# Patient Record
Sex: Male | Born: 1955 | Hispanic: Yes | Marital: Married | Smoking: Former smoker
Health system: Southern US, Community
[De-identification: ages and names within clinical notes are randomized; demographics above are authoritative.]

## PROBLEM LIST (undated history)

## (undated) DIAGNOSIS — N390 Urinary tract infection, site not specified: Secondary | ICD-10-CM

## (undated) DIAGNOSIS — N400 Enlarged prostate without lower urinary tract symptoms: Secondary | ICD-10-CM

## (undated) DIAGNOSIS — T4145XA Adverse effect of unspecified anesthetic, initial encounter: Secondary | ICD-10-CM

## (undated) DIAGNOSIS — C189 Malignant neoplasm of colon, unspecified: Secondary | ICD-10-CM

## (undated) DIAGNOSIS — IMO0002 Reserved for concepts with insufficient information to code with codable children: Secondary | ICD-10-CM

## (undated) DIAGNOSIS — C801 Malignant (primary) neoplasm, unspecified: Secondary | ICD-10-CM

## (undated) DIAGNOSIS — I10 Essential (primary) hypertension: Secondary | ICD-10-CM

## (undated) DIAGNOSIS — T8859XA Other complications of anesthesia, initial encounter: Secondary | ICD-10-CM

## (undated) HISTORY — PX: OTHER SURGICAL HISTORY: SHX169

## (undated) HISTORY — PX: COLON RESECTION: SHX5231

## (undated) HISTORY — PX: COLON SURGERY: SHX602

## (undated) HISTORY — DX: Reserved for concepts with insufficient information to code with codable children: IMO0002

---

## 2004-09-13 ENCOUNTER — Ambulatory Visit (HOSPITAL_COMMUNITY): Admission: RE | Admit: 2004-09-13 | Discharge: 2004-09-13 | Payer: Self-pay | Admitting: General Surgery

## 2009-08-21 ENCOUNTER — Encounter: Payer: Self-pay | Admitting: Internal Medicine

## 2009-08-22 ENCOUNTER — Telehealth (INDEPENDENT_AMBULATORY_CARE_PROVIDER_SITE_OTHER): Payer: Self-pay

## 2009-09-07 ENCOUNTER — Ambulatory Visit (HOSPITAL_COMMUNITY): Admission: RE | Admit: 2009-09-07 | Discharge: 2009-09-07 | Payer: Self-pay | Admitting: Internal Medicine

## 2009-09-07 ENCOUNTER — Telehealth (INDEPENDENT_AMBULATORY_CARE_PROVIDER_SITE_OTHER): Payer: Self-pay

## 2009-09-07 ENCOUNTER — Ambulatory Visit: Payer: Self-pay | Admitting: Internal Medicine

## 2009-09-11 ENCOUNTER — Encounter (INDEPENDENT_AMBULATORY_CARE_PROVIDER_SITE_OTHER): Payer: Self-pay | Admitting: *Deleted

## 2009-09-11 ENCOUNTER — Encounter: Payer: Self-pay | Admitting: Internal Medicine

## 2009-09-27 ENCOUNTER — Ambulatory Visit (HOSPITAL_COMMUNITY): Admission: RE | Admit: 2009-09-27 | Discharge: 2009-09-27 | Payer: Self-pay | Admitting: General Surgery

## 2009-10-01 ENCOUNTER — Encounter (INDEPENDENT_AMBULATORY_CARE_PROVIDER_SITE_OTHER): Payer: Self-pay | Admitting: General Surgery

## 2009-10-01 ENCOUNTER — Inpatient Hospital Stay (HOSPITAL_COMMUNITY): Admission: RE | Admit: 2009-10-01 | Discharge: 2009-10-05 | Payer: Self-pay | Admitting: General Surgery

## 2009-10-31 ENCOUNTER — Encounter (HOSPITAL_COMMUNITY): Admission: RE | Admit: 2009-10-31 | Discharge: 2009-11-30 | Payer: Self-pay | Admitting: Oncology

## 2009-10-31 ENCOUNTER — Ambulatory Visit (HOSPITAL_COMMUNITY): Payer: Self-pay | Admitting: Oncology

## 2009-11-01 ENCOUNTER — Ambulatory Visit (HOSPITAL_COMMUNITY): Payer: Self-pay | Admitting: Oncology

## 2009-11-02 ENCOUNTER — Ambulatory Visit (HOSPITAL_COMMUNITY): Admission: RE | Admit: 2009-11-02 | Discharge: 2009-11-02 | Payer: Self-pay | Admitting: General Surgery

## 2009-11-07 ENCOUNTER — Ambulatory Visit (HOSPITAL_COMMUNITY): Admission: RE | Admit: 2009-11-07 | Discharge: 2009-11-07 | Payer: Self-pay | Admitting: Oncology

## 2009-11-14 ENCOUNTER — Encounter: Payer: Self-pay | Admitting: Internal Medicine

## 2009-11-30 ENCOUNTER — Encounter (HOSPITAL_COMMUNITY): Admission: RE | Admit: 2009-11-30 | Discharge: 2009-11-30 | Payer: Self-pay | Admitting: Oncology

## 2009-12-10 ENCOUNTER — Encounter (HOSPITAL_COMMUNITY)
Admission: RE | Admit: 2009-12-10 | Discharge: 2010-01-09 | Payer: Self-pay | Source: Home / Self Care | Admitting: Oncology

## 2009-12-13 ENCOUNTER — Encounter: Payer: Self-pay | Admitting: Internal Medicine

## 2009-12-23 ENCOUNTER — Emergency Department (HOSPITAL_COMMUNITY): Admission: EM | Admit: 2009-12-23 | Discharge: 2009-12-23 | Payer: Self-pay | Admitting: Emergency Medicine

## 2009-12-31 ENCOUNTER — Ambulatory Visit (HOSPITAL_COMMUNITY): Payer: Self-pay | Admitting: Oncology

## 2010-01-09 ENCOUNTER — Encounter (HOSPITAL_COMMUNITY)
Admission: RE | Admit: 2010-01-09 | Discharge: 2010-02-08 | Payer: Self-pay | Source: Home / Self Care | Attending: Oncology | Admitting: Oncology

## 2010-01-16 ENCOUNTER — Inpatient Hospital Stay (HOSPITAL_COMMUNITY): Admission: AD | Admit: 2010-01-16 | Discharge: 2010-01-20 | Payer: Self-pay | Admitting: Psychiatry

## 2010-01-16 ENCOUNTER — Emergency Department (HOSPITAL_COMMUNITY)
Admission: EM | Admit: 2010-01-16 | Discharge: 2010-01-16 | Disposition: A | Discharge status: Other Acute Inpatient Hospital | Payer: Self-pay | Source: Home / Self Care | Admitting: Emergency Medicine

## 2010-01-16 ENCOUNTER — Ambulatory Visit: Payer: Self-pay | Admitting: Psychiatry

## 2010-01-17 ENCOUNTER — Encounter: Payer: Self-pay | Admitting: Internal Medicine

## 2010-01-23 ENCOUNTER — Ambulatory Visit (HOSPITAL_COMMUNITY): Payer: Self-pay | Admitting: Oncology

## 2010-01-28 ENCOUNTER — Ambulatory Visit (HOSPITAL_COMMUNITY): Payer: Self-pay | Admitting: Psychology

## 2010-01-31 ENCOUNTER — Encounter: Payer: Self-pay | Admitting: Gastroenterology

## 2010-02-11 ENCOUNTER — Encounter (HOSPITAL_COMMUNITY)
Admission: RE | Admit: 2010-02-11 | Discharge: 2010-03-13 | Payer: Self-pay | Source: Home / Self Care | Attending: Oncology | Admitting: Oncology

## 2010-02-11 ENCOUNTER — Ambulatory Visit (HOSPITAL_COMMUNITY): Payer: Self-pay | Admitting: Oncology

## 2010-02-14 ENCOUNTER — Ambulatory Visit (HOSPITAL_COMMUNITY): Payer: Self-pay | Admitting: Psychology

## 2010-03-06 LAB — DIFFERENTIAL
Basophils Absolute: 0 10*3/uL (ref 0.0–0.1)
Basophils Relative: 0 % (ref 0–1)
Eosinophils Absolute: 0.1 10*3/uL (ref 0.0–0.7)
Eosinophils Relative: 3 % (ref 0–5)
Lymphocytes Relative: 42 % (ref 12–46)
Lymphs Abs: 1.6 10*3/uL (ref 0.7–4.0)
Monocytes Absolute: 0.4 10*3/uL (ref 0.1–1.0)
Monocytes Relative: 13 % — ABNORMAL HIGH (ref 3–12)
Neutro Abs: 1.5 10*3/uL — ABNORMAL LOW (ref 1.7–7.7)
Neutrophils Relative %: 40 % — ABNORMAL LOW (ref 43–77)

## 2010-03-06 LAB — CBC
HCT: 34.3 % — ABNORMAL LOW (ref 39.0–52.0)
Hemoglobin: 12.2 g/dL — ABNORMAL LOW (ref 13.0–17.0)
MCH: 30.1 pg (ref 26.0–34.0)
MCHC: 35.6 g/dL (ref 30.0–36.0)
MCV: 84.7 fL (ref 78.0–100.0)
Platelets: 115 10*3/uL — ABNORMAL LOW (ref 150–400)
RBC: 4.05 MIL/uL — ABNORMAL LOW (ref 4.22–5.81)
RDW: 21.8 % — ABNORMAL HIGH (ref 11.5–15.5)
WBC: 3.7 10*3/uL — ABNORMAL LOW (ref 4.0–10.5)

## 2010-03-14 ENCOUNTER — Encounter: Payer: Self-pay | Admitting: Internal Medicine

## 2010-03-15 ENCOUNTER — Encounter (HOSPITAL_COMMUNITY)
Admission: RE | Admit: 2010-03-15 | Discharge: 2010-04-02 | Payer: Self-pay | Source: Home / Self Care | Attending: Oncology | Admitting: Oncology

## 2010-03-18 ENCOUNTER — Ambulatory Visit (HOSPITAL_COMMUNITY)
Admission: RE | Admit: 2010-03-18 | Discharge: 2010-03-18 | Payer: Self-pay | Source: Home / Self Care | Attending: Psychology | Admitting: Psychology

## 2010-03-18 ENCOUNTER — Ambulatory Visit (HOSPITAL_COMMUNITY): Admit: 2010-03-18 | Payer: Self-pay | Admitting: Oncology

## 2010-03-21 ENCOUNTER — Ambulatory Visit (HOSPITAL_COMMUNITY): Admit: 2010-03-21 | Payer: Self-pay | Admitting: Psychiatry

## 2010-03-22 ENCOUNTER — Other Ambulatory Visit (HOSPITAL_COMMUNITY): Payer: Self-pay | Admitting: Oncology

## 2010-03-22 DIAGNOSIS — C189 Malignant neoplasm of colon, unspecified: Secondary | ICD-10-CM

## 2010-03-28 ENCOUNTER — Encounter: Payer: Self-pay | Admitting: Internal Medicine

## 2010-04-02 NOTE — Letter (Signed)
Summary: Internal Other Domingo Dimes  Internal Other Domingo Dimes   Imported By: Cloria Spring LPN 04/54/0981 19:14:78  _____________________________________________________________________  External Attachment:    Type:   Image     Comment:   External Document

## 2010-04-02 NOTE — Letter (Signed)
Summary: [facility]  [facility]   Imported By: Rexene Alberts 01/17/2010 08:06:29  _____________________________________________________________________  External Attachment:    Type:   Image     Comment:   External Document

## 2010-04-02 NOTE — Letter (Signed)
Summary: Internal Other  Internal Other   Imported By: Peggyann Shoals 09/11/2009 15:17:03  _____________________________________________________________________  External Attachment:    Type:   Image     Comment:   External Document

## 2010-04-02 NOTE — Letter (Signed)
Summary: Scheduled Appointment  [facility]  [address]   [place], [place] [address]   Phone: [phone]  Fax: [phone]    September 11, 2009   Dear: [name]            DOB: [birth year]    I have been instructed to schedule you an appointment with Dr. Tilford Pillar.  Your appointment is as follows:   Date: __________07/21/11_________   Time: ________2:00pm___________     Please be here 15 minutes early.    Please contact their office @ [phone] if you need to reschedule this appointment for a more convenient time.   Thank you,    [facility] Ph: [phone]   Fax: [phone]

## 2010-04-02 NOTE — Letter (Signed)
Summary: OFFICE PROGRESS NOTE  OFFICE PROGRESS NOTE   Imported By: Rexene Alberts 12/13/2009 11:20:39  _____________________________________________________________________  External Attachment:    Type:   Image     Comment:   External Document

## 2010-04-02 NOTE — Progress Notes (Signed)
Summary: phone call/ slight change in time of procedure  Phone Note Outgoing Call   Call placed to: Patient Summary of Call: I called and left message to call. A 30 min change in appt for procedure. Needs to be at [facility] @ 10:00 AM  instead of 10:30. Procedure scheduled for 11:00 AM in stead of 11:30.  Initial call taken by: Cloria Spring LPN,  August 22, 2009 3:00 PM     Appended Document: phone call/ slight change in time of procedure Pt's wife informed of the change in time.

## 2010-04-02 NOTE — Letter (Signed)
Summary: [facility]  [facility]   Imported By: Rexene Alberts 01/31/2010 09:48:21  _____________________________________________________________________  External Attachment:    Type:   Image     Comment:   External Document

## 2010-04-02 NOTE — Progress Notes (Signed)
Summary: phone note/ surgeon choice  Phone Note Call from Patient   Caller: Patient Summary of Call: Pt's wife called to say Dr. Jena Gauss told them today that pt would need referral to surgeon....see opt note. They are requesting Dr. Leticia Penna. Initial call taken by: Cloria Spring LPN,  September 07, 452 2:35 PM     Appended Document: phone note/ surgeon choice OK; please set up referral  Appended Document: phone note/ surgeon choice Pt's referral set up with Dr Leticia Penna on 07/21@ 2:00pm - Unable to reach pt by phone - letter mailed - cdg

## 2010-04-02 NOTE — Letter (Signed)
Summary: [facility]  [facility]   Imported By: Rexene Alberts 11/14/2009 16:13:23  _____________________________________________________________________  External Attachment:    Type:   Image     Comment:   External Document  Appended Document: [facility] needs tcs in about 11 mos  Appended Document: [facility] reminder in computer

## 2010-04-04 NOTE — Letter (Signed)
Summary: [facility]  [facility]   Imported By: Rexene Alberts 03/14/2010 09:14:18  _____________________________________________________________________  External Attachment:    Type:   Image     Comment:   External Document

## 2010-04-04 NOTE — Letter (Signed)
Summary: [facility]  [facility]   Imported By: Rexene Alberts 03/28/2010 10:15:09  _____________________________________________________________________  External Attachment:    Type:   Image     Comment:   External Document

## 2010-04-17 ENCOUNTER — Encounter (HOSPITAL_COMMUNITY): Payer: BC Managed Care – PPO

## 2010-04-17 DIAGNOSIS — Z452 Encounter for adjustment and management of vascular access device: Secondary | ICD-10-CM

## 2010-04-17 DIAGNOSIS — C189 Malignant neoplasm of colon, unspecified: Secondary | ICD-10-CM

## 2010-05-10 ENCOUNTER — Encounter (HOSPITAL_COMMUNITY): Payer: BC Managed Care – PPO | Attending: Oncology

## 2010-05-10 ENCOUNTER — Other Ambulatory Visit (HOSPITAL_COMMUNITY): Payer: Self-pay

## 2010-05-10 DIAGNOSIS — C189 Malignant neoplasm of colon, unspecified: Secondary | ICD-10-CM

## 2010-05-10 DIAGNOSIS — Z79899 Other long term (current) drug therapy: Secondary | ICD-10-CM | POA: Insufficient documentation

## 2010-05-10 DIAGNOSIS — I10 Essential (primary) hypertension: Secondary | ICD-10-CM | POA: Insufficient documentation

## 2010-05-14 ENCOUNTER — Encounter (HOSPITAL_COMMUNITY): Payer: Self-pay

## 2010-05-14 ENCOUNTER — Ambulatory Visit (HOSPITAL_COMMUNITY)
Admission: RE | Admit: 2010-05-14 | Discharge: 2010-05-14 | Disposition: A | Discharge status: Home / Self Care | Payer: BC Managed Care – PPO | Source: Ambulatory Visit | Attending: Oncology | Admitting: Oncology

## 2010-05-14 DIAGNOSIS — C189 Malignant neoplasm of colon, unspecified: Secondary | ICD-10-CM

## 2010-05-14 DIAGNOSIS — Z85038 Personal history of other malignant neoplasm of large intestine: Secondary | ICD-10-CM | POA: Insufficient documentation

## 2010-05-14 DIAGNOSIS — K7689 Other specified diseases of liver: Secondary | ICD-10-CM | POA: Insufficient documentation

## 2010-05-14 DIAGNOSIS — Z9049 Acquired absence of other specified parts of digestive tract: Secondary | ICD-10-CM | POA: Insufficient documentation

## 2010-05-14 DIAGNOSIS — Z09 Encounter for follow-up examination after completed treatment for conditions other than malignant neoplasm: Secondary | ICD-10-CM | POA: Insufficient documentation

## 2010-05-14 HISTORY — DX: Malignant (primary) neoplasm, unspecified: C80.1

## 2010-05-14 HISTORY — DX: Essential (primary) hypertension: I10

## 2010-05-14 LAB — COMPREHENSIVE METABOLIC PANEL
ALT: 23 U/L (ref 0–53)
ALT: 33 U/L (ref 0–53)
AST: 33 U/L (ref 0–37)
AST: 42 U/L — ABNORMAL HIGH (ref 0–37)
Albumin: 3.9 g/dL (ref 3.5–5.2)
Albumin: 4.3 g/dL (ref 3.5–5.2)
Alkaline Phosphatase: 99 U/L (ref 39–117)
Alkaline Phosphatase: 75 U/L (ref 39–117)
BUN: 14 mg/dL (ref 6–23)
BUN: 15 mg/dL (ref 6–23)
CO2: 25 mEq/L (ref 19–32)
CO2: 28 mEq/L (ref 19–32)
Calcium: 9.3 mg/dL (ref 8.4–10.5)
Calcium: 9.7 mg/dL (ref 8.4–10.5)
Chloride: 104 mEq/L (ref 96–112)
Chloride: 103 mEq/L (ref 96–112)
Creatinine, Ser: 0.89 mg/dL (ref 0.4–1.5)
Creatinine, Ser: 1.09 mg/dL (ref 0.4–1.5)
GFR calc Af Amer: >60 mL/min (ref >60)
GFR calc Af Amer: >60 mL/min (ref >60)
GFR calc non Af Amer: >60 mL/min (ref >60)
GFR calc non Af Amer: >60 mL/min (ref >60)
Glucose, Bld: 194 mg/dL — ABNORMAL HIGH (ref 70–99)
Glucose, Bld: 112 mg/dL — ABNORMAL HIGH (ref 70–99)
Potassium: 3.7 mEq/L (ref 3.5–5.1)
Potassium: 3.8 mEq/L (ref 3.5–5.1)
Sodium: 138 mEq/L (ref 135–145)
Sodium: 140 mEq/L (ref 135–145)
Total Bilirubin: 0.8 mg/dL (ref 0.3–1.2)
Total Bilirubin: 1.2 mg/dL (ref 0.3–1.2)
Total Protein: 6.5 g/dL (ref 6.0–8.3)
Total Protein: 7.3 g/dL (ref 6.0–8.3)

## 2010-05-14 LAB — CBC
HCT: 35 % — ABNORMAL LOW (ref 39.0–52.0)
HCT: 37 % — ABNORMAL LOW (ref 39.0–52.0)
HCT: 38.4 % — ABNORMAL LOW (ref 39.0–52.0)
Hemoglobin: 12.2 g/dL — ABNORMAL LOW (ref 13.0–17.0)
Hemoglobin: 12.4 g/dL — ABNORMAL LOW (ref 13.0–17.0)
Hemoglobin: 13.1 g/dL (ref 13.0–17.0)
MCH: 28.3 pg (ref 26.0–34.0)
MCH: 26.5 pg (ref 26.0–34.0)
MCH: 27.5 pg (ref 26.0–34.0)
MCHC: 34.9 g/dL (ref 30.0–36.0)
MCHC: 33.6 g/dL (ref 30.0–36.0)
MCHC: 34 g/dL (ref 30.0–36.0)
MCV: 81.2 fL (ref 78.0–100.0)
MCV: 79 fL (ref 78.0–100.0)
MCV: 80.9 fL (ref 78.0–100.0)
Platelets: 110 10*3/uL — ABNORMAL LOW (ref 150–400)
Platelets: 126 10*3/uL — ABNORMAL LOW (ref 150–400)
Platelets: 159 10*3/uL (ref 150–400)
RBC: 4.31 MIL/uL (ref 4.22–5.81)
RBC: 4.69 MIL/uL (ref 4.22–5.81)
RBC: 4.75 MIL/uL (ref 4.22–5.81)
RDW: 26.5 % — ABNORMAL HIGH (ref 11.5–15.5)
RDW: 27.6 % — ABNORMAL HIGH (ref 11.5–15.5)
RDW: 32.2 % — ABNORMAL HIGH (ref 11.5–15.5)
WBC: 3.6 10*3/uL — ABNORMAL LOW (ref 4.0–10.5)
WBC: 4.3 10*3/uL (ref 4.0–10.5)
WBC: 5.3 10*3/uL (ref 4.0–10.5)

## 2010-05-14 LAB — RAPID URINE DRUG SCREEN, HOSP PERFORMED
Amphetamines: NOT DETECTED
Barbiturates: NOT DETECTED
Benzodiazepines: POSITIVE — AB
Cocaine: NOT DETECTED
Opiates: NOT DETECTED
Tetrahydrocannabinol: NOT DETECTED

## 2010-05-14 LAB — DIFFERENTIAL
Basophils Absolute: 0 10*3/uL (ref 0.0–0.1)
Basophils Absolute: 0 10*3/uL (ref 0.0–0.1)
Basophils Relative: 0 % (ref 0–1)
Basophils Relative: 0 % (ref 0–1)
Eosinophils Absolute: 0.2 10*3/uL (ref 0.0–0.7)
Eosinophils Absolute: 0.1 10*3/uL (ref 0.0–0.7)
Eosinophils Relative: 4 % (ref 0–5)
Eosinophils Relative: 2 % (ref 0–5)
Lymphocytes Relative: 42 % (ref 12–46)
Lymphocytes Relative: 32 % (ref 12–46)
Lymphs Abs: 1.5 10*3/uL (ref 0.7–4.0)
Lymphs Abs: 1.4 10*3/uL (ref 0.7–4.0)
Monocytes Absolute: 0.4 10*3/uL (ref 0.1–1.0)
Monocytes Absolute: 0.7 10*3/uL (ref 0.1–1.0)
Monocytes Relative: 11 % (ref 3–12)
Monocytes Relative: 16 % — ABNORMAL HIGH (ref 3–12)
Neutro Abs: 1.6 10*3/uL — ABNORMAL LOW (ref 1.7–7.7)
Neutro Abs: 2.1 10*3/uL (ref 1.7–7.7)
Neutrophils Relative %: 43 % (ref 43–77)
Neutrophils Relative %: 50 % (ref 43–77)

## 2010-05-14 LAB — BASIC METABOLIC PANEL
BUN: 7 mg/dL (ref 6–23)
CO2: 23 mEq/L (ref 19–32)
Calcium: 9.5 mg/dL (ref 8.4–10.5)
Chloride: 104 mEq/L (ref 96–112)
Creatinine, Ser: 0.95 mg/dL (ref 0.4–1.5)
GFR calc Af Amer: >60 mL/min (ref >60)
GFR calc non Af Amer: >60 mL/min (ref >60)
Glucose, Bld: 125 mg/dL — ABNORMAL HIGH (ref 70–99)
Potassium: 3.7 mEq/L (ref 3.5–5.1)
Sodium: 139 mEq/L (ref 135–145)

## 2010-05-14 LAB — ETHANOL: Alcohol, Ethyl (B): <5 mg/dL (ref 0–10)

## 2010-05-14 LAB — CEA: CEA: 1.8 ng/mL (ref 0.0–5.0)

## 2010-05-14 MED ORDER — IOHEXOL 300 MG/ML  SOLN
100.0000 mL | Freq: Once | INTRAMUSCULAR | Status: AC | PRN
  Administered 2010-05-14: 100 mL via INTRAVENOUS

## 2010-05-15 ENCOUNTER — Ambulatory Visit (HOSPITAL_COMMUNITY): Payer: Self-pay | Admitting: Oncology

## 2010-05-15 ENCOUNTER — Other Ambulatory Visit (HOSPITAL_COMMUNITY): Payer: Self-pay | Admitting: Oncology

## 2010-05-15 DIAGNOSIS — C189 Malignant neoplasm of colon, unspecified: Secondary | ICD-10-CM

## 2010-05-15 LAB — COMPREHENSIVE METABOLIC PANEL
ALT: 25 U/L (ref 0–53)
AST: 23 U/L (ref 0–37)
AST: 24 U/L (ref 0–37)
Albumin: 3.9 g/dL (ref 3.5–5.2)
CO2: 25 mEq/L (ref 19–32)
CO2: 25 mEq/L (ref 19–32)
Calcium: 9.2 mg/dL (ref 8.4–10.5)
Chloride: 102 mEq/L (ref 96–112)
Creatinine, Ser: 0.77 mg/dL (ref 0.4–1.5)
Creatinine, Ser: 0.84 mg/dL (ref 0.4–1.5)
GFR calc Af Amer: >60 mL/min (ref >60)
GFR calc Af Amer: >60 mL/min (ref >60)
GFR calc non Af Amer: >60 mL/min (ref >60)
Glucose, Bld: 136 mg/dL — ABNORMAL HIGH (ref 70–99)
Sodium: 138 mEq/L (ref 135–145)
Total Bilirubin: 1 mg/dL (ref 0.3–1.2)

## 2010-05-15 LAB — DIFFERENTIAL
Basophils Absolute: 0 10*3/uL (ref 0.0–0.1)
Eosinophils Absolute: 0 10*3/uL (ref 0.0–0.7)
Eosinophils Absolute: 0.1 10*3/uL (ref 0.0–0.7)
Eosinophils Relative: 1 % (ref 0–5)
Eosinophils Relative: 3 % (ref 0–5)
Lymphocytes Relative: 28 % (ref 12–46)
Lymphocytes Relative: 33 % (ref 12–46)
Lymphs Abs: 1.7 10*3/uL (ref 0.7–4.0)
Monocytes Absolute: 0.5 10*3/uL (ref 0.1–1.0)
Monocytes Relative: 9 % (ref 3–12)
Neutrophils Relative %: 53 % (ref 43–77)

## 2010-05-15 LAB — URINALYSIS, ROUTINE W REFLEX MICROSCOPIC
Bilirubin Urine: NEGATIVE
Glucose, UA: NEGATIVE mg/dL
Hgb urine dipstick: NEGATIVE
Ketones, ur: NEGATIVE mg/dL
Protein, ur: NEGATIVE mg/dL
pH: 6 (ref 5.0–8.0)

## 2010-05-15 LAB — CBC
HCT: 33.7 % — ABNORMAL LOW (ref 39.0–52.0)
Hemoglobin: 11.1 g/dL — ABNORMAL LOW (ref 13.0–17.0)
Hemoglobin: 11.2 g/dL — ABNORMAL LOW (ref 13.0–17.0)
MCH: 25 pg — ABNORMAL LOW (ref 26.0–34.0)
MCH: 25.4 pg — ABNORMAL LOW (ref 26.0–34.0)
MCHC: 33 g/dL (ref 30.0–36.0)
MCHC: 33.7 g/dL (ref 30.0–36.0)
MCV: 76.9 fL — ABNORMAL LOW (ref 78.0–100.0)
Platelets: 138 10*3/uL — ABNORMAL LOW (ref 150–400)
RBC: 4.49 MIL/uL (ref 4.22–5.81)

## 2010-05-16 LAB — COMPREHENSIVE METABOLIC PANEL
ALT: 29 U/L (ref 0–53)
AST: 24 U/L (ref 0–37)
AST: 18 U/L (ref 0–37)
Albumin: 4.1 g/dL (ref 3.5–5.2)
Albumin: 4.3 g/dL (ref 3.5–5.2)
Alkaline Phosphatase: 84 U/L (ref 39–117)
Calcium: 9.3 mg/dL (ref 8.4–10.5)
Chloride: 105 mEq/L (ref 96–112)
Creatinine, Ser: 0.65 mg/dL (ref 0.4–1.5)
GFR calc Af Amer: >60 mL/min (ref >60)
Potassium: 3.8 mEq/L (ref 3.5–5.1)
Sodium: 132 mEq/L — ABNORMAL LOW (ref 135–145)
Total Protein: 6.8 g/dL (ref 6.0–8.3)
Total Protein: 7.1 g/dL (ref 6.0–8.3)

## 2010-05-16 LAB — BASIC METABOLIC PANEL
BUN: 9 mg/dL (ref 6–23)
Calcium: 9 mg/dL (ref 8.4–10.5)
GFR calc non Af Amer: >60 mL/min (ref >60)
Glucose, Bld: 146 mg/dL — ABNORMAL HIGH (ref 70–99)
Potassium: 3.5 mEq/L (ref 3.5–5.1)

## 2010-05-16 LAB — CBC
HCT: 33.3 % — ABNORMAL LOW (ref 39.0–52.0)
HCT: 33.5 % — ABNORMAL LOW (ref 39.0–52.0)
Hemoglobin: 10.7 g/dL — ABNORMAL LOW (ref 13.0–17.0)
MCHC: 33.7 g/dL (ref 30.0–36.0)
MCHC: 32.3 g/dL (ref 30.0–36.0)
Platelets: 186 10*3/uL (ref 150–400)
Platelets: 222 10*3/uL (ref 150–400)
RDW: 16.5 % — ABNORMAL HIGH (ref 11.5–15.5)
RDW: 16.7 % — ABNORMAL HIGH (ref 11.5–15.5)
RDW: 16.2 % — ABNORMAL HIGH (ref 11.5–15.5)
WBC: 4.9 10*3/uL (ref 4.0–10.5)

## 2010-05-16 LAB — DIFFERENTIAL
Basophils Absolute: 0 10*3/uL (ref 0.0–0.1)
Basophils Relative: 0 % (ref 0–1)
Basophils Relative: 1 % (ref 0–1)
Eosinophils Absolute: 0.1 10*3/uL (ref 0.0–0.7)
Eosinophils Absolute: 0.1 10*3/uL (ref 0.0–0.7)
Eosinophils Relative: 1 % (ref 0–5)
Lymphocytes Relative: 40 % (ref 12–46)
Lymphs Abs: 1.8 10*3/uL (ref 0.7–4.0)
Monocytes Absolute: 0.5 10*3/uL (ref 0.1–1.0)
Monocytes Absolute: 0.5 10*3/uL (ref 0.1–1.0)
Monocytes Absolute: 0.4 10*3/uL (ref 0.1–1.0)
Monocytes Relative: 11 % (ref 3–12)
Monocytes Relative: 9 % (ref 3–12)
Neutro Abs: 1.4 10*3/uL — ABNORMAL LOW (ref 1.7–7.7)
Neutro Abs: 2.1 10*3/uL (ref 1.7–7.7)

## 2010-05-16 LAB — CEA: CEA: 1.3 ng/mL (ref 0.0–5.0)

## 2010-05-17 ENCOUNTER — Encounter (HOSPITAL_COMMUNITY): Payer: Self-pay | Admitting: Psychology

## 2010-05-17 LAB — DIFFERENTIAL
Basophils Absolute: 0 10*3/uL (ref 0.0–0.1)
Basophils Absolute: 0 10*3/uL (ref 0.0–0.1)
Basophils Absolute: 0 K/uL (ref 0.0–0.1)
Basophils Relative: 0 % (ref 0–1)
Basophils Relative: 0 % (ref 0–1)
Basophils Relative: 0 % (ref 0–1)
Eosinophils Absolute: 0 10*3/uL (ref 0.0–0.7)
Eosinophils Absolute: 0.1 K/uL (ref 0.0–0.7)
Eosinophils Absolute: 0.2 10*3/uL (ref 0.0–0.7)
Eosinophils Relative: 0 % (ref 0–5)
Eosinophils Relative: 1 % (ref 0–5)
Eosinophils Relative: 3 % (ref 0–5)
Lymphocytes Relative: 20 % (ref 12–46)
Lymphs Abs: 1 10*3/uL (ref 0.7–4.0)
Lymphs Abs: 1.1 10*3/uL (ref 0.7–4.0)
Lymphs Abs: 1.4 K/uL (ref 0.7–4.0)
Monocytes Absolute: 0.4 10*3/uL (ref 0.1–1.0)
Monocytes Absolute: 0.6 K/uL (ref 0.1–1.0)
Monocytes Relative: 9 % (ref 3–12)
Neutro Abs: 3.8 10*3/uL (ref 1.7–7.7)
Neutro Abs: 4.9 K/uL (ref 1.7–7.7)
Neutrophils Relative %: 70 % (ref 43–77)
Neutrophils Relative %: 71 % (ref 43–77)
Neutrophils Relative %: 77 % (ref 43–77)

## 2010-05-17 LAB — BASIC METABOLIC PANEL
BUN: 12 mg/dL (ref 6–23)
CO2: 28 mEq/L (ref 19–32)
Calcium: 8.5 mg/dL (ref 8.4–10.5)
Chloride: 102 mEq/L (ref 96–112)
Creatinine, Ser: 0.86 mg/dL (ref 0.4–1.5)
GFR calc Af Amer: >60 mL/min (ref >60)

## 2010-05-17 LAB — COMPREHENSIVE METABOLIC PANEL
ALT: 22 U/L (ref 0–53)
AST: 21 U/L (ref 0–37)
AST: 19 U/L (ref 0–37)
Alkaline Phosphatase: 88 U/L (ref 39–117)
Alkaline Phosphatase: 52 U/L (ref 39–117)
BUN: 16 mg/dL (ref 6–23)
BUN: 8 mg/dL (ref 6–23)
CO2: 24 mEq/L (ref 19–32)
CO2: 30 mEq/L (ref 19–32)
Calcium: 9.2 mg/dL (ref 8.4–10.5)
Chloride: 107 mEq/L (ref 96–112)
Chloride: 103 mEq/L (ref 96–112)
Chloride: 104 mEq/L (ref 96–112)
Creatinine, Ser: 0.65 mg/dL (ref 0.4–1.5)
GFR calc Af Amer: >60 mL/min (ref >60)
GFR calc Af Amer: >60 mL/min (ref >60)
GFR calc non Af Amer: >60 mL/min (ref >60)
GFR calc non Af Amer: >60 mL/min (ref >60)
Glucose, Bld: 98 mg/dL (ref 70–99)
Potassium: 4.4 mEq/L (ref 3.5–5.1)
Potassium: 3.3 mEq/L — ABNORMAL LOW (ref 3.5–5.1)
Sodium: 139 mEq/L (ref 135–145)
Total Bilirubin: 0.3 mg/dL (ref 0.3–1.2)
Total Bilirubin: 0.5 mg/dL (ref 0.3–1.2)

## 2010-05-17 LAB — COMPREHENSIVE METABOLIC PANEL WITH GFR
ALT: 24 U/L (ref 0–53)
AST: 21 U/L (ref 0–37)
Albumin: 3.4 g/dL — ABNORMAL LOW (ref 3.5–5.2)
Alkaline Phosphatase: 53 U/L (ref 39–117)
BUN: 9 mg/dL (ref 6–23)
CO2: 29 meq/L (ref 19–32)
Calcium: 8.8 mg/dL (ref 8.4–10.5)
Chloride: 102 meq/L (ref 96–112)
Creatinine, Ser: 0.79 mg/dL (ref 0.4–1.5)
GFR calc Af Amer: >60 mL/min (ref >60)
GFR calc non Af Amer: >60 mL/min (ref >60)
Glucose, Bld: 105 mg/dL — ABNORMAL HIGH (ref 70–99)
Potassium: 3.5 meq/L (ref 3.5–5.1)
Sodium: 135 meq/L (ref 135–145)
Total Bilirubin: 0.6 mg/dL (ref 0.3–1.2)
Total Protein: 6.3 g/dL (ref 6.0–8.3)

## 2010-05-17 LAB — CBC
HCT: 33.7 % — ABNORMAL LOW (ref 39.0–52.0)
HCT: 25.9 % — ABNORMAL LOW (ref 39.0–52.0)
HCT: 27.7 % — ABNORMAL LOW (ref 39.0–52.0)
HCT: 29.6 % — ABNORMAL LOW (ref 39.0–52.0)
Hemoglobin: 10.8 g/dL — ABNORMAL LOW (ref 13.0–17.0)
Hemoglobin: 8.5 g/dL — ABNORMAL LOW (ref 13.0–17.0)
Hemoglobin: 9.2 g/dL — ABNORMAL LOW (ref 13.0–17.0)
Hemoglobin: 9.8 g/dL — ABNORMAL LOW (ref 13.0–17.0)
MCH: 23.7 pg — ABNORMAL LOW (ref 26.0–34.0)
MCH: 25.2 pg — ABNORMAL LOW (ref 26.0–34.0)
MCH: 25.3 pg — ABNORMAL LOW (ref 26.0–34.0)
MCH: 25.3 pg — ABNORMAL LOW (ref 26.0–34.0)
MCH: 25.5 pg — ABNORMAL LOW (ref 26.0–34.0)
MCHC: 32.1 g/dL (ref 30.0–36.0)
MCHC: 32.8 g/dL (ref 30.0–36.0)
MCHC: 32.8 g/dL (ref 30.0–36.0)
MCHC: 33 g/dL (ref 30.0–36.0)
MCHC: 33.1 g/dL (ref 30.0–36.0)
MCV: 76.4 fL — ABNORMAL LOW (ref 78.0–100.0)
MCV: 76.7 fL — ABNORMAL LOW (ref 78.0–100.0)
MCV: 76.8 fL — ABNORMAL LOW (ref 78.0–100.0)
MCV: 77 fL — ABNORMAL LOW (ref 78.0–100.0)
Platelets: 186 K/uL (ref 150–400)
Platelets: 187 K/uL (ref 150–400)
Platelets: 215 10*3/uL (ref 150–400)
Platelets: 227 K/uL (ref 150–400)
RBC: 4.57 MIL/uL (ref 4.22–5.81)
RBC: 3.38 MIL/uL — ABNORMAL LOW (ref 4.22–5.81)
RBC: 3.61 MIL/uL — ABNORMAL LOW (ref 4.22–5.81)
RBC: 3.83 MIL/uL — ABNORMAL LOW (ref 4.22–5.81)
RBC: 3.87 MIL/uL — ABNORMAL LOW (ref 4.22–5.81)
RDW: 14.1 % (ref 11.5–15.5)
RDW: 14.2 % (ref 11.5–15.5)
RDW: 14.2 % (ref 11.5–15.5)
RDW: 14.6 % (ref 11.5–15.5)
WBC: 5.4 K/uL (ref 4.0–10.5)
WBC: 5.4 K/uL (ref 4.0–10.5)
WBC: 7 K/uL (ref 4.0–10.5)

## 2010-05-18 LAB — CBC
MCH: 25.4 pg — ABNORMAL LOW (ref 26.0–34.0)
MCHC: 33 g/dL (ref 30.0–36.0)
Platelets: 216 10*3/uL (ref 150–400)
RBC: 3.96 MIL/uL — ABNORMAL LOW (ref 4.22–5.81)
RDW: 13.9 % (ref 11.5–15.5)

## 2010-05-18 LAB — CROSSMATCH

## 2010-05-18 LAB — BASIC METABOLIC PANEL
BUN: 17 mg/dL (ref 6–23)
CO2: 27 mEq/L (ref 19–32)
Calcium: 9.2 mg/dL (ref 8.4–10.5)
Creatinine, Ser: 0.69 mg/dL (ref 0.4–1.5)
GFR calc Af Amer: >60 mL/min (ref >60)
Glucose, Bld: 90 mg/dL (ref 70–99)

## 2010-05-18 LAB — SURGICAL PCR SCREEN: Staphylococcus aureus: NEGATIVE

## 2010-05-19 LAB — HEPATIC FUNCTION PANEL
ALT: 28 U/L (ref 0–53)
Albumin: 3.9 g/dL (ref 3.5–5.2)
Alkaline Phosphatase: 61 U/L (ref 39–117)
Total Bilirubin: 0.6 mg/dL (ref 0.3–1.2)

## 2010-05-19 LAB — BASIC METABOLIC PANEL
BUN: 13 mg/dL (ref 6–23)
Chloride: 106 mEq/L (ref 96–112)
Glucose, Bld: 139 mg/dL — ABNORMAL HIGH (ref 70–99)
Potassium: 3.5 mEq/L (ref 3.5–5.1)
Sodium: 134 mEq/L — ABNORMAL LOW (ref 135–145)

## 2010-05-19 LAB — CBC
HCT: 31.6 % — ABNORMAL LOW (ref 39.0–52.0)
Hemoglobin: 10.5 g/dL — ABNORMAL LOW (ref 13.0–17.0)
MCHC: 33.3 g/dL (ref 30.0–36.0)
MCV: 79.4 fL (ref 78.0–100.0)
RDW: 12.8 % (ref 11.5–15.5)

## 2010-05-20 ENCOUNTER — Encounter (HOSPITAL_COMMUNITY): Payer: Self-pay | Admitting: Psychology

## 2010-05-22 ENCOUNTER — Other Ambulatory Visit (HOSPITAL_COMMUNITY): Payer: Self-pay | Admitting: Oncology

## 2010-05-22 DIAGNOSIS — R918 Other nonspecific abnormal finding of lung field: Secondary | ICD-10-CM

## 2010-05-29 ENCOUNTER — Encounter (HOSPITAL_COMMUNITY): Payer: Self-pay

## 2010-05-29 DIAGNOSIS — C189 Malignant neoplasm of colon, unspecified: Secondary | ICD-10-CM

## 2010-05-29 DIAGNOSIS — Z452 Encounter for adjustment and management of vascular access device: Secondary | ICD-10-CM

## 2010-07-09 ENCOUNTER — Other Ambulatory Visit (HOSPITAL_COMMUNITY): Payer: Self-pay

## 2010-07-12 ENCOUNTER — Encounter (HOSPITAL_COMMUNITY): Payer: Self-pay | Attending: Oncology

## 2010-07-12 DIAGNOSIS — Z452 Encounter for adjustment and management of vascular access device: Secondary | ICD-10-CM

## 2010-07-12 DIAGNOSIS — C189 Malignant neoplasm of colon, unspecified: Secondary | ICD-10-CM

## 2010-08-16 ENCOUNTER — Other Ambulatory Visit (HOSPITAL_COMMUNITY): Payer: Self-pay | Admitting: Oncology

## 2010-08-16 ENCOUNTER — Encounter (HOSPITAL_COMMUNITY): Payer: BC Managed Care – PPO | Attending: Oncology | Admitting: Oncology

## 2010-08-16 DIAGNOSIS — Z85038 Personal history of other malignant neoplasm of large intestine: Secondary | ICD-10-CM | POA: Insufficient documentation

## 2010-08-16 DIAGNOSIS — Z09 Encounter for follow-up examination after completed treatment for conditions other than malignant neoplasm: Secondary | ICD-10-CM | POA: Insufficient documentation

## 2010-08-16 DIAGNOSIS — C189 Malignant neoplasm of colon, unspecified: Secondary | ICD-10-CM

## 2010-08-16 LAB — CBC
HCT: 40.7 % (ref 39.0–52.0)
MCHC: 33.4 g/dL (ref 30.0–36.0)
Platelets: 146 10*3/uL — ABNORMAL LOW (ref 150–400)
RDW: 15.8 % — ABNORMAL HIGH (ref 11.5–15.5)
WBC: 3.8 10*3/uL — ABNORMAL LOW (ref 4.0–10.5)

## 2010-08-16 LAB — DIFFERENTIAL
Basophils Absolute: 0 10*3/uL (ref 0.0–0.1)
Basophils Relative: 0 % (ref 0–1)
Lymphocytes Relative: 37 % (ref 12–46)
Monocytes Absolute: 0.3 10*3/uL (ref 0.1–1.0)
Neutro Abs: 2 10*3/uL (ref 1.7–7.7)
Neutrophils Relative %: 53 % (ref 43–77)

## 2010-08-16 LAB — COMPREHENSIVE METABOLIC PANEL
ALT: 44 U/L (ref 0–53)
AST: 31 U/L (ref 0–37)
Albumin: 4.6 g/dL (ref 3.5–5.2)
CO2: 26 mEq/L (ref 19–32)
Chloride: 102 mEq/L (ref 96–112)
Creatinine, Ser: 0.7 mg/dL (ref 0.50–1.35)
GFR calc non Af Amer: >60 mL/min (ref >60)
Potassium: 4 mEq/L (ref 3.5–5.1)
Sodium: 136 mEq/L (ref 135–145)
Total Bilirubin: 0.4 mg/dL (ref 0.3–1.2)

## 2010-08-30 ENCOUNTER — Encounter (HOSPITAL_COMMUNITY): Payer: BC Managed Care – PPO

## 2010-08-30 DIAGNOSIS — C189 Malignant neoplasm of colon, unspecified: Secondary | ICD-10-CM

## 2010-09-06 ENCOUNTER — Encounter: Payer: Self-pay | Admitting: Internal Medicine

## 2010-09-18 ENCOUNTER — Ambulatory Visit: Payer: BC Managed Care – PPO | Admitting: Gastroenterology

## 2010-10-04 ENCOUNTER — Ambulatory Visit: Payer: BC Managed Care – PPO | Admitting: Urgent Care

## 2010-10-11 ENCOUNTER — Encounter (HOSPITAL_COMMUNITY): Payer: BC Managed Care – PPO | Attending: Oncology

## 2010-10-11 DIAGNOSIS — C189 Malignant neoplasm of colon, unspecified: Secondary | ICD-10-CM | POA: Insufficient documentation

## 2010-10-11 LAB — COMPREHENSIVE METABOLIC PANEL
Alkaline Phosphatase: 107 U/L (ref 39–117)
BUN: 16 mg/dL (ref 6–23)
Calcium: 9.5 mg/dL (ref 8.4–10.5)
GFR calc Af Amer: >60 mL/min (ref >60)
Glucose, Bld: 115 mg/dL — ABNORMAL HIGH (ref 70–99)
Total Protein: 7.6 g/dL (ref 6.0–8.3)

## 2010-10-11 LAB — DIFFERENTIAL
Eosinophils Absolute: 0.1 10*3/uL (ref 0.0–0.7)
Eosinophils Relative: 3 % (ref 0–5)
Lymphs Abs: 1.8 10*3/uL (ref 0.7–4.0)
Monocytes Absolute: 0.4 10*3/uL (ref 0.1–1.0)
Monocytes Relative: 8 % (ref 3–12)

## 2010-10-11 LAB — CBC
Hemoglobin: 14.2 g/dL (ref 13.0–17.0)
MCH: 28.3 pg (ref 26.0–34.0)
MCV: 83.4 fL (ref 78.0–100.0)
RBC: 5.01 MIL/uL (ref 4.22–5.81)

## 2010-10-11 MED ORDER — HEPARIN SOD (PORK) LOCK FLUSH 100 UNIT/ML IV SOLN
INTRAVENOUS | Status: AC
  Filled 2010-10-11: qty 5

## 2010-10-11 NOTE — Progress Notes (Unsigned)
Port accessed and flushed per clinic protocol.  Good blood return.  Specimen obtained for labs.  Tolerated well.

## 2010-10-12 LAB — CEA: CEA: 1.8 ng/mL (ref 0.0–5.0)

## 2010-10-15 NOTE — H&P (Signed)
  NTS SOAP Note  Vital Signs:  Vitals as of: 10/15/2010: Systolic 130: Diastolic 79: Heart Rate 63: Temp 98.24F: Height 36ft 9in: Weight 212Lbs 0 Ounces: Pain Level 0: BMI 31  BMI : 31.31 kg/m2  Subjective: This 55 Years 90 Months old Male presents for of follow up on colon cancer. Had laparoscopic right hemicolectomy by Dr. Leticia Penna in 8/11, and now presents for follow up colonoscopy. Denies any GI complaints at the present time.  Review of Symptoms:  Constitutional: unremarkable  Head: unremarkable  Eyes:unremarkable  Nose/Mouth/Throat:unremarkable  Cardiovascular:unremarkable  Respiratory: unremarkable  Gastrointestinal:unremarkable  Genitourinary: reduced stream Musculoskeletal: unremarkable  Skin: unremarkable  Hematolgic/Lymphatic: unremarkable  Allergic/Immunologic:unremarkable     Past Medical History:Reviewed  Past Medical History  Surgical History: Lap right partial colectomy 8/11,  Medical Problems: High Blood pressure, BPH Allergies: nkda Medications: metoprolol, flomax   Social History: Reviewed   Social History  Preferred Language: English ([place]) Race: American [place] or [place] Native Ethnicity: Not Hispanic / Latino Age: 55 Years 10 Months Marital Status: S Alcohol: No Recreational drug(s): No   Smoking Status: Never smoker reviewed on 10/15/2010  Family History: Reviewed  Family History  Is there a family history ZO:[id number]   Medication Allergies:   Allergies Insert Code:   Objective Information:  General: Well appearing, well nourished in no distress.  Skin:no rash or prominent lesions  Head: Atraumatic; no masses; no abnormalities  Neck: Supple without lymphadenopathy.  Heart: RRR, no murmur or gallop. Normal S1, S2. No S3, S4.  Lungs:CTA bilaterally, no wheezes, rhonchi, rales. Breathing unlabored.  Abdomen: Soft, NT/ND, no HSM, no masses.  deferred to procedure  Assessment: colon carcinoma  Diagnosis &  Procedure: DiagnosisCode: 153.6, ProcedureCode: 19147,   Orders:trilyte prescribed     Plan:Scheduled for TCS on 11/01/10.    Patient Education: Alternative treatments to surgery were discussed with patient (and family).Risks and benefits of procedure were fully explained to the patient (and family) who gave informed consent. Patient/family questions were addressed.  Follow-up: Pending Surgery

## 2010-10-16 ENCOUNTER — Other Ambulatory Visit (HOSPITAL_COMMUNITY): Payer: BC Managed Care – PPO

## 2010-11-01 ENCOUNTER — Ambulatory Visit (HOSPITAL_COMMUNITY)
Admission: RE | Admit: 2010-11-01 | Discharge: 2010-11-01 | Disposition: A | Discharge status: Home / Self Care | Payer: BC Managed Care – PPO | Source: Ambulatory Visit | Attending: General Surgery | Admitting: General Surgery

## 2010-11-01 ENCOUNTER — Encounter (HOSPITAL_COMMUNITY)
Admission: RE | Disposition: A | Discharge status: Home / Self Care | Payer: Self-pay | Source: Ambulatory Visit | Attending: General Surgery

## 2010-11-01 ENCOUNTER — Encounter (HOSPITAL_COMMUNITY): Payer: Self-pay

## 2010-11-01 DIAGNOSIS — Z85038 Personal history of other malignant neoplasm of large intestine: Secondary | ICD-10-CM | POA: Insufficient documentation

## 2010-11-01 DIAGNOSIS — Z09 Encounter for follow-up examination after completed treatment for conditions other than malignant neoplasm: Secondary | ICD-10-CM | POA: Insufficient documentation

## 2010-11-01 DIAGNOSIS — I10 Essential (primary) hypertension: Secondary | ICD-10-CM | POA: Insufficient documentation

## 2010-11-01 DIAGNOSIS — Z9049 Acquired absence of other specified parts of digestive tract: Secondary | ICD-10-CM | POA: Insufficient documentation

## 2010-11-01 DIAGNOSIS — K5730 Diverticulosis of large intestine without perforation or abscess without bleeding: Secondary | ICD-10-CM | POA: Insufficient documentation

## 2010-11-01 HISTORY — DX: Other complications of anesthesia, initial encounter: T88.59XA

## 2010-11-01 HISTORY — DX: Benign prostatic hyperplasia without lower urinary tract symptoms: N40.0

## 2010-11-01 HISTORY — DX: Adverse effect of unspecified anesthetic, initial encounter: T41.45XA

## 2010-11-01 HISTORY — PX: COLONOSCOPY: SHX5424

## 2010-11-01 HISTORY — DX: Urinary tract infection, site not specified: N39.0

## 2010-11-01 SURGERY — COLONOSCOPY
Anesthesia: Moderate Sedation

## 2010-11-01 MED ORDER — MIDAZOLAM HCL 5 MG/5ML IJ SOLN
INTRAMUSCULAR | Status: DC | PRN
  Administered 2010-11-01: 1 mg via INTRAVENOUS
  Administered 2010-11-01: 4 mg via INTRAVENOUS

## 2010-11-01 MED ORDER — STERILE WATER FOR IRRIGATION IR SOLN
Status: DC | PRN
  Administered 2010-11-01: 08:00:00

## 2010-11-01 MED ORDER — MIDAZOLAM HCL 5 MG/5ML IJ SOLN
INTRAMUSCULAR | Status: AC
  Filled 2010-11-01: qty 10

## 2010-11-01 MED ORDER — MEPERIDINE HCL 25 MG/ML IJ SOLN
INTRAMUSCULAR | Status: DC | PRN
  Administered 2010-11-01: 25 mg via INTRAVENOUS
  Administered 2010-11-01: 50 mg via INTRAVENOUS

## 2010-11-01 MED ORDER — MEPERIDINE HCL 100 MG/ML IJ SOLN
INTRAMUSCULAR | Status: AC
  Filled 2010-11-01: qty 1

## 2010-11-01 MED ORDER — SODIUM CHLORIDE 0.45 % IV SOLN
Freq: Once | INTRAVENOUS | Status: AC
  Administered 2010-11-01: 07:00:00 via INTRAVENOUS

## 2010-11-11 ENCOUNTER — Encounter (HOSPITAL_COMMUNITY): Payer: Self-pay | Admitting: General Surgery

## 2010-11-18 ENCOUNTER — Other Ambulatory Visit (HOSPITAL_COMMUNITY): Payer: BC Managed Care – PPO

## 2010-11-22 ENCOUNTER — Ambulatory Visit (HOSPITAL_COMMUNITY)
Admission: RE | Admit: 2010-11-22 | Discharge: 2010-11-22 | Disposition: A | Discharge status: Home / Self Care | Payer: BC Managed Care – PPO | Source: Ambulatory Visit | Attending: Oncology | Admitting: Oncology

## 2010-11-22 ENCOUNTER — Encounter (HOSPITAL_COMMUNITY): Payer: BC Managed Care – PPO | Attending: Oncology

## 2010-11-22 DIAGNOSIS — Z452 Encounter for adjustment and management of vascular access device: Secondary | ICD-10-CM

## 2010-11-22 DIAGNOSIS — J984 Other disorders of lung: Secondary | ICD-10-CM | POA: Insufficient documentation

## 2010-11-22 DIAGNOSIS — R918 Other nonspecific abnormal finding of lung field: Secondary | ICD-10-CM

## 2010-11-22 DIAGNOSIS — C189 Malignant neoplasm of colon, unspecified: Secondary | ICD-10-CM

## 2010-11-22 MED ORDER — SODIUM CHLORIDE 0.9 % IJ SOLN
10.0000 mL | Freq: Once | INTRAMUSCULAR | Status: AC
  Administered 2010-11-22: 10 mL via INTRAVENOUS
  Filled 2010-11-22: qty 10

## 2010-11-22 MED ORDER — HEPARIN SOD (PORK) LOCK FLUSH 100 UNIT/ML IV SOLN
500.0000 [IU] | Freq: Once | INTRAVENOUS | Status: AC
  Administered 2010-11-22: 500 [IU] via INTRAVENOUS
  Filled 2010-11-22: qty 5

## 2010-11-22 MED ORDER — HEPARIN SOD (PORK) LOCK FLUSH 100 UNIT/ML IV SOLN
INTRAVENOUS | Status: AC
  Filled 2010-11-22: qty 5

## 2010-11-22 NOTE — Progress Notes (Signed)
Martin Marquez presented for Portacath access and flush. Proper placement of portacath confirmed by CXR. Portacath located lt chest wall accessed with  H 20 needle. Good blood return present. Portacath flushed with 20ml NS and 500U/44ml Heparin and needle removed intact. Procedure without incident. Patient tolerated procedure well.

## 2011-01-03 ENCOUNTER — Encounter (HOSPITAL_COMMUNITY): Payer: 59 | Attending: Oncology

## 2011-01-03 ENCOUNTER — Other Ambulatory Visit (HOSPITAL_COMMUNITY): Payer: BC Managed Care – PPO

## 2011-01-03 DIAGNOSIS — C189 Malignant neoplasm of colon, unspecified: Secondary | ICD-10-CM

## 2011-01-03 DIAGNOSIS — Z452 Encounter for adjustment and management of vascular access device: Secondary | ICD-10-CM | POA: Insufficient documentation

## 2011-01-03 DIAGNOSIS — Z85038 Personal history of other malignant neoplasm of large intestine: Secondary | ICD-10-CM | POA: Insufficient documentation

## 2011-01-03 MED ORDER — HEPARIN SOD (PORK) LOCK FLUSH 100 UNIT/ML IV SOLN
500.0000 [IU] | Freq: Once | INTRAVENOUS | Status: AC
  Administered 2011-01-03: 500 [IU] via INTRAVENOUS
  Filled 2011-01-03: qty 5

## 2011-01-03 MED ORDER — HEPARIN SOD (PORK) LOCK FLUSH 100 UNIT/ML IV SOLN
INTRAVENOUS | Status: AC
  Administered 2011-01-03: 500 [IU] via INTRAVENOUS
  Filled 2011-01-03: qty 5

## 2011-01-03 MED ORDER — SODIUM CHLORIDE 0.9 % IJ SOLN
10.0000 mL | INTRAMUSCULAR | Status: DC | PRN
  Administered 2011-01-03: 10 mL via INTRAVENOUS
  Filled 2011-01-03: qty 10

## 2011-01-03 NOTE — Progress Notes (Signed)
Martin Marquez presented for Portacath access and flush. Proper placement of portacath confirmed by CXR. Portacath located left chest wall accessed with  H 20 needle. Good blood return present. Lab drawn for CEA. Portacath flushed with 20ml NS and 500U/91ml Heparin and needle removed intact. Procedure without incident. Patient tolerated procedure well.

## 2011-02-14 ENCOUNTER — Encounter (HOSPITAL_COMMUNITY): Payer: 59 | Attending: Oncology

## 2011-02-14 DIAGNOSIS — C189 Malignant neoplasm of colon, unspecified: Secondary | ICD-10-CM | POA: Insufficient documentation

## 2011-02-14 DIAGNOSIS — Z452 Encounter for adjustment and management of vascular access device: Secondary | ICD-10-CM

## 2011-02-14 MED ORDER — HEPARIN SOD (PORK) LOCK FLUSH 100 UNIT/ML IV SOLN
INTRAVENOUS | Status: AC
  Administered 2011-02-14: 500 [IU] via INTRAVENOUS
  Filled 2011-02-14: qty 5

## 2011-02-14 MED ORDER — SODIUM CHLORIDE 0.9 % IJ SOLN
INTRAMUSCULAR | Status: AC
  Administered 2011-02-14: 10 mL via INTRAVENOUS
  Filled 2011-02-14: qty 10

## 2011-02-14 MED ORDER — SODIUM CHLORIDE 0.9 % IJ SOLN
10.0000 mL | INTRAMUSCULAR | Status: DC | PRN
  Administered 2011-02-14: 10 mL via INTRAVENOUS
  Filled 2011-02-14: qty 10

## 2011-02-14 MED ORDER — HEPARIN SOD (PORK) LOCK FLUSH 100 UNIT/ML IV SOLN
500.0000 [IU] | Freq: Once | INTRAVENOUS | Status: AC
  Administered 2011-02-14: 500 [IU] via INTRAVENOUS
  Filled 2011-02-14: qty 5

## 2011-02-14 NOTE — Progress Notes (Signed)
Martin Marquez presented for Portacath access and flush. Proper placement of portacath confirmed by CXR. Portacath located left chest wall accessed with  H 20 needle. Good blood return present. Portacath flushed with 20ml NS and 500U/105ml Heparin and needle removed intact. Procedure without incident. Patient tolerated procedure well.

## 2011-02-17 ENCOUNTER — Ambulatory Visit (HOSPITAL_COMMUNITY): Payer: BC Managed Care – PPO | Admitting: Oncology

## 2011-02-21 ENCOUNTER — Ambulatory Visit (HOSPITAL_COMMUNITY): Payer: Self-pay | Admitting: Oncology

## 2011-03-28 ENCOUNTER — Encounter (HOSPITAL_COMMUNITY): Payer: 59 | Attending: Oncology

## 2011-03-28 ENCOUNTER — Encounter (HOSPITAL_BASED_OUTPATIENT_CLINIC_OR_DEPARTMENT_OTHER): Payer: 59 | Admitting: Oncology

## 2011-03-28 VITALS — BP 128/71 | HR 69 | Temp 96.0°F | Wt 216.4 lb

## 2011-03-28 DIAGNOSIS — C189 Malignant neoplasm of colon, unspecified: Secondary | ICD-10-CM

## 2011-03-28 DIAGNOSIS — C182 Malignant neoplasm of ascending colon: Secondary | ICD-10-CM

## 2011-03-28 MED ORDER — SODIUM CHLORIDE 0.9 % IJ SOLN
INTRAMUSCULAR | Status: AC
  Administered 2011-03-28: 10 mL via INTRAVENOUS
  Filled 2011-03-28: qty 10

## 2011-03-28 MED ORDER — HEPARIN SOD (PORK) LOCK FLUSH 100 UNIT/ML IV SOLN
INTRAVENOUS | Status: AC
  Administered 2011-03-28: 500 [IU] via INTRAVENOUS
  Filled 2011-03-28: qty 5

## 2011-03-28 MED ORDER — SODIUM CHLORIDE 0.9 % IJ SOLN
10.0000 mL | INTRAMUSCULAR | Status: DC | PRN
  Administered 2011-03-28: 10 mL via INTRAVENOUS
  Filled 2011-03-28: qty 10

## 2011-03-28 MED ORDER — HEPARIN SOD (PORK) LOCK FLUSH 100 UNIT/ML IV SOLN
500.0000 [IU] | Freq: Once | INTRAVENOUS | Status: AC
  Administered 2011-03-28: 500 [IU] via INTRAVENOUS
  Filled 2011-03-28: qty 5

## 2011-03-28 NOTE — Progress Notes (Signed)
Martin Marquez presented for Portacath access and flush. Proper placement of portacath confirmed by CXR. Portacath located left chest wall accessed with  H 20 needle. Good blood return present. Portacath flushed with 20ml NS and 500U/5ml Heparin and needle removed intact. Procedure without incident. Patient tolerated procedure well.   

## 2011-03-28 NOTE — Progress Notes (Signed)
Dwana Melena, MD, MD [address] [place] [address]  1. Colon cancer  metoprolol tartrate (LOPRESSOR) 25 MG tablet, sodium chloride 0.9 % injection 10 mL, heparin lock flush 100 unit/mL, sodium chloride 0.9 % injection, heparin lock flush 100 UNIT/ML injection, CBC, Differential, Comprehensive metabolic panel, CEA, CEA    CURRENT THERAPY: S/P resection followed by Xeloda/Oxaliplatin chemotherapy x 6 cycles   INTERVAL HISTORY: Martin Marquez 56 y.o. male returns for  regular  visit for followup of Stage III B Colon Cancer.  The patient denies any complaints.  He is working full time as an Personnel officer.  He feels great.  He underwent a colonoscopy by Dr. Lovell Sheehan in 10/2010.  The patient reports that Dr. Lovell Sheehan reported a clean colon, without any abnormalities and he will have a repeat colonoscopy in 3 years.  We do not have the Op-note, so I will ask the nurse to ascertain this.   We will get lab work today: CEA, and we will get more lab work with next port flush prior to his surveillance CT scan.   The patient complains of difficulty starting his urine stream and multiple episodes of urination at night, approximately 5 times.   Past Medical History  Diagnosis Date  . Complication of anesthesia   . Cancer     colon / resection/ chemo  . UTI (lower urinary tract infection)   . BPH (benign prostatic hyperplasia)   . Hypertension     stopped bp meds after chemo    has Colon cancer on his problem list.      has no known allergies.  Martin Marquez had no medications administered during this visit.  Past Surgical History  Procedure Date  . Colon resection     2011-aph  . Colonoscopy 11/01/2010    Procedure: COLONOSCOPY;  Surgeon: Dalia Heading;  Location: [facility];  Service: Gastroenterology;  Laterality: N/A;    Denies any headaches, dizziness, double vision, fevers, chills, night sweats, nausea, vomiting, diarrhea, constipation, chest pain, heart palpitations, shortness  of breath, blood in stool, black tarry stool, urinary pain, urinary burning, urinary frequency, hematuria.   PHYSICAL EXAMINATION  ECOG PERFORMANCE STATUS: 0 - Asymptomatic  Filed Vitals:   03/28/11 1329  BP: 128/71  Pulse: 69  Temp: 96 F (35.6 C)    GENERAL:alert, healthy, no distress, well nourished, well developed, comfortable, cooperative, obese and smiling SKIN: skin color, texture, turgor are normal, no rashes or significant lesions HEAD: Normocephalic, No masses, lesions, tenderness or abnormalities EYES: normal EARS: External ears normal OROPHARYNX:mucous membranes are moist  NECK: supple, no adenopathy, no bruits, thyroid normal size, non-tender, without nodularity, no stridor, non-tender, trachea midline LYMPH:  no palpable lymphadenopathy, no hepatosplenomegaly BREAST:not examined LUNGS: clear to auscultation and percussion HEART: regular rate & rhythm, no murmurs, no gallops, S1 normal and S2 normal ABDOMEN:abdomen soft, non-tender, obese, normal bowel sounds, no masses or organomegaly and no hepatosplenomegaly BACK: Back symmetric, no curvature., No CVA tenderness EXTREMITIES:less then 2 second capillary refill, no joint deformities, effusion, or inflammation, no edema, no skin discoloration, no clubbing, no cyanosis  NEURO: alert & oriented x 3 with fluent speech, no focal motor/sensory deficits, gait normal   LABORATORY DATA: CBC    Component Value Date/Time   WBC 4.9 10/11/2010 1214   RBC 5.01 10/11/2010 1214   HGB 14.2 10/11/2010 1214   HCT 41.8 10/11/2010 1214   PLT 162 10/11/2010 1214   MCV 83.4 10/11/2010 1214   MCH 28.3 10/11/2010 1214  MCHC 34.0 10/11/2010 1214   RDW 15.3 10/11/2010 1214   LYMPHSABS 1.8 10/11/2010 1214   MONOABS 0.4 10/11/2010 1214   EOSABS 0.1 10/11/2010 1214   BASOSABS 0.0 10/11/2010 1214      Chemistry      Component Value Date/Time   NA 138 10/11/2010 1214   K 3.8 10/11/2010 1214   CL 103 10/11/2010 1214   CO2 23 10/11/2010 1214    BUN 16 10/11/2010 1214   CREATININE 0.59 10/11/2010 1214      Component Value Date/Time   CALCIUM 9.5 10/11/2010 1214   ALKPHOS 107 10/11/2010 1214   AST 25 10/11/2010 1214   ALT 37 10/11/2010 1214   BILITOT 0.3 10/11/2010 1214        ASSESSMENT:  1. 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 Peripheral neuropathy, resolved 3. Mood disorder, depression, and anxiety, reactive 4. BPH   PLAN:  1. Lab work today: CEA 2. Lab work with next port flush: CBC diff, CMET, CEA 3. CEA every 3 months 4. CT Ab/Pelvis in March for his yearly surveillance 5. I personally reviewed and went over laboratory results with the patient. 6. Recommend he follow-up with Dr. Jerre Simon about his BPH symptoms.  7. Return following CT scan for follow-up, and then 6 months later.  8. Will ascertain Op-Note from Dr. Lovell Sheehan.   All questions were answered. The patient knows to call the clinic with any problems, questions or concerns. We can certainly see the patient much sooner if necessary.  The patient and plan discussed with Glenford Peers, MD and he is in agreement with the aforementioned.  I spent 20 minutes counseling the patient face to face. The total time spent in the appointment was 30 minutes.  Gwyn Hieronymus

## 2011-03-28 NOTE — Patient Instructions (Signed)
[facility]  Discharge Instructions  RECOMMENDATIONS MADE BY THE CONSULTANT AND ANY TEST RESULTS WILL BE SENT TO YOUR REFERRING DOCTOR.   EXAM FINDINGS BY MD TODAY AND SIGNS AND SYMPTOMS TO REPORT TO CLINIC OR PRIMARY MD: Exam good today per MD. Port flush done today, due again in 6 weeks. Return to clinic as scheduled to see MD. Report any problems or concerns to this clinic as needed.    I acknowledge that I have been informed and understand all the instructions given to me and received a copy. I do not have any more questions at this time, but understand that I may call the Specialty Clinic at [facility] at [phone] during business hours should I have any further questions or need assistance in obtaining follow-up care.    __________________________________________  _____________  __________ Signature of Patient or Authorized Representative            Date                   Time    __________________________________________ Nurse's Signature

## 2011-03-29 LAB — CEA: CEA: 0.6 ng/mL (ref 0.0–5.0)

## 2011-05-09 ENCOUNTER — Encounter (HOSPITAL_COMMUNITY): Payer: 59 | Attending: Oncology

## 2011-05-09 DIAGNOSIS — C189 Malignant neoplasm of colon, unspecified: Secondary | ICD-10-CM

## 2011-05-09 DIAGNOSIS — C182 Malignant neoplasm of ascending colon: Secondary | ICD-10-CM

## 2011-05-09 LAB — DIFFERENTIAL
Basophils Absolute: 0 10*3/uL (ref 0.0–0.1)
Basophils Relative: 0 % (ref 0–1)
Eosinophils Absolute: 0.1 10*3/uL (ref 0.0–0.7)
Lymphocytes Relative: 36 % (ref 12–46)
Neutrophils Relative %: 52 % (ref 43–77)

## 2011-05-09 LAB — COMPREHENSIVE METABOLIC PANEL
ALT: 45 U/L (ref 0–53)
AST: 29 U/L (ref 0–37)
Albumin: 4.4 g/dL (ref 3.5–5.2)
Calcium: 9.7 mg/dL (ref 8.4–10.5)
Sodium: 138 mEq/L (ref 135–145)
Total Protein: 7.5 g/dL (ref 6.0–8.3)

## 2011-05-09 LAB — CBC
Hemoglobin: 14.9 g/dL (ref 13.0–17.0)
MCHC: 35.5 g/dL (ref 30.0–36.0)
Platelets: 151 10*3/uL (ref 150–400)
RDW: 12.5 % (ref 11.5–15.5)

## 2011-05-09 MED ORDER — HEPARIN SOD (PORK) LOCK FLUSH 100 UNIT/ML IV SOLN
500.0000 [IU] | Freq: Once | INTRAVENOUS | Status: AC
  Administered 2011-05-09: 500 [IU] via INTRAVENOUS
  Filled 2011-05-09: qty 5

## 2011-05-09 MED ORDER — SODIUM CHLORIDE 0.9 % IJ SOLN
10.0000 mL | INTRAMUSCULAR | Status: DC | PRN
  Administered 2011-05-09: 10 mL via INTRAVENOUS
  Filled 2011-05-09: qty 10

## 2011-05-09 MED ORDER — HEPARIN SOD (PORK) LOCK FLUSH 100 UNIT/ML IV SOLN
INTRAVENOUS | Status: AC
  Administered 2011-05-09: 500 [IU] via INTRAVENOUS
  Filled 2011-05-09: qty 5

## 2011-05-09 NOTE — Progress Notes (Signed)
Martin Marquez presented for Portacath access and flush.  Proper placement of portacath confirmed by CXR.  Portacath located left chest wall accessed with  H 20 needle.  Good blood return present up to 5cc. Portacath flushed with 20ml NS and 500U/52ml Heparin and needle removed intact.  Procedure without incident. Venipuncture performed with a 23 gauge butterfly needle to L Antecubital.  Martin Marquez tolerated all procedures well and without incident; all questions were answered and patient was discharged.  Patient was also given contrast medium to drink for his CT abd/pelvis with contrast scheduled for next Friday.  Information sheet also given with details regarding NPO status and schedule for drinking contrast.  Patient verbalized understanding.

## 2011-05-13 ENCOUNTER — Telehealth (HOSPITAL_COMMUNITY): Payer: Self-pay | Admitting: *Deleted

## 2011-05-13 NOTE — Telephone Encounter (Signed)
Message copied by Dennie Maizes on Tue May 13, 2011  9:14 AM ------      Message from: Ellouise Newer III      Created: Tue May 13, 2011  8:32 AM       CEA good.  Call patient

## 2011-05-13 NOTE — Telephone Encounter (Signed)
No available working telephone number.

## 2011-05-15 ENCOUNTER — Other Ambulatory Visit (HOSPITAL_COMMUNITY): Payer: Self-pay

## 2011-05-16 ENCOUNTER — Ambulatory Visit (HOSPITAL_COMMUNITY)
Admission: RE | Admit: 2011-05-16 | Discharge: 2011-05-16 | Disposition: A | Discharge status: Home / Self Care | Payer: 59 | Source: Ambulatory Visit | Attending: Oncology | Admitting: Oncology

## 2011-05-16 ENCOUNTER — Telehealth (HOSPITAL_COMMUNITY): Payer: Self-pay | Admitting: *Deleted

## 2011-05-16 ENCOUNTER — Ambulatory Visit (HOSPITAL_COMMUNITY): Payer: 59

## 2011-05-16 DIAGNOSIS — C189 Malignant neoplasm of colon, unspecified: Secondary | ICD-10-CM | POA: Insufficient documentation

## 2011-05-16 DIAGNOSIS — K5730 Diverticulosis of large intestine without perforation or abscess without bleeding: Secondary | ICD-10-CM | POA: Insufficient documentation

## 2011-05-16 DIAGNOSIS — Z9221 Personal history of antineoplastic chemotherapy: Secondary | ICD-10-CM | POA: Insufficient documentation

## 2011-05-16 MED ORDER — IOHEXOL 300 MG/ML  SOLN
100.0000 mL | Freq: Once | INTRAMUSCULAR | Status: AC | PRN
  Administered 2011-05-16: 100 mL via INTRAVENOUS

## 2011-05-16 NOTE — Telephone Encounter (Signed)
No answer to call and unable to leave message/not available.

## 2011-05-16 NOTE — Telephone Encounter (Signed)
Message copied by Dennie Maizes on Fri May 16, 2011  2:46 PM ------      Message from: Ellouise Newer III      Created: Fri May 16, 2011  1:32 PM       Let patient know his CT was good,

## 2011-05-20 ENCOUNTER — Encounter (HOSPITAL_BASED_OUTPATIENT_CLINIC_OR_DEPARTMENT_OTHER): Payer: 59 | Admitting: Oncology

## 2011-05-20 VITALS — BP 119/62 | HR 59 | Temp 97.9°F | Ht 69.0 in | Wt 213.0 lb

## 2011-05-20 DIAGNOSIS — C182 Malignant neoplasm of ascending colon: Secondary | ICD-10-CM

## 2011-05-20 DIAGNOSIS — C189 Malignant neoplasm of colon, unspecified: Secondary | ICD-10-CM

## 2011-05-20 NOTE — Patient Instructions (Signed)
Martin Marquez  [id number] [birth year]   [facility]  Discharge Instructions  RECOMMENDATIONS MADE BY THE CONSULTANT AND ANY TEST RESULTS WILL BE SENT TO YOUR REFERRING DOCTOR.   EXAM FINDINGS BY MD TODAY AND SIGNS AND SYMPTOMS TO REPORT TO CLINIC OR PRIMARY MD: You are doing well.  Report changes in bowel habits, blood in your stool, etc.  MEDICATIONS PRESCRIBED: none     SPECIAL INSTRUCTIONS/FOLLOW-UP: Lab work Needed CEA every 3 months, port flush every 6 weeks and Return to Clinic in 6 months.   I acknowledge that I have been informed and understand all the instructions given to me and received a copy. I do not have any more questions at this time, but understand that I may call the Specialty Clinic at [facility] at [phone] during business hours should I have any further questions or need assistance in obtaining follow-up care.    __________________________________________  _____________  __________ Signature of Patient or Authorized Representative            Date                   Time    __________________________________________ Nurse's Signature

## 2011-05-20 NOTE — Progress Notes (Signed)
Problem #1 stage III adenocarcinoma of the right colon 4.5 cm in size invading into the muscularis propria and into the pericolonic fat. No LV I was seen but 3 of 14 lymph nodes were positive for metastatic disease. Date of surgery was 10/01/2009. He is now status post 6 cycles of adjuvant chemotherapy with oxaliplatinum and capecitabine. His last treatment date was 03/06/2010.  Problem #2 obesity with fatty infiltration of his liver seen on his CT scan.  Problem #3 vitiligo but without any history of thyroid disease or B12 deficiency etc.  Martin Marquez has a little bit of neuropathic changes of his feet but it is grade 1 or less. He feels very good his appetite is too good and his weight is too high for his height. He weighs 213 pounds on a 5 foot 9 inches frame. His the BMI  is over 30. His bowels are working well  His vital signs are stable. He has an intact Port-A-Cath. He has no lymphadenopathy. His lungs are clear. His heart shows a regular rhythm and rate without murmur rub or gallop. His hands of course show the vitiligo quite obviously. His abdomen is obese nontender but his liver is felt approximately 2-3 Cml below the costal margin on inspiration with an overall span of 12 cm by percussion and palpation. His legs are without edema. He is alert and oriented.  His lab work is excellent except for a mildly elevated blood sugar. His CAT scans the other day showed fatty infiltration of the liver but no cancer in any location. We will see him every 3 months for a CEA level and Jenita Seashore will see him in 6 months. We will do a CAT scan in 1 year. I have spoken with him extensively about his diet and need for change in lifestyle so he can lose weight and not have progression of fatty infiltration of his liver to cirrhosis. He and his wife both seem to understand.

## 2011-06-20 ENCOUNTER — Encounter (HOSPITAL_COMMUNITY): Payer: 59

## 2011-06-20 ENCOUNTER — Encounter (HOSPITAL_COMMUNITY): Payer: 59 | Attending: Oncology

## 2011-06-20 DIAGNOSIS — Z452 Encounter for adjustment and management of vascular access device: Secondary | ICD-10-CM

## 2011-06-20 DIAGNOSIS — C189 Malignant neoplasm of colon, unspecified: Secondary | ICD-10-CM | POA: Insufficient documentation

## 2011-06-20 MED ORDER — HEPARIN SOD (PORK) LOCK FLUSH 100 UNIT/ML IV SOLN
INTRAVENOUS | Status: AC
  Filled 2011-06-20: qty 5

## 2011-06-20 MED ORDER — SODIUM CHLORIDE 0.9 % IJ SOLN
10.0000 mL | INTRAMUSCULAR | Status: DC | PRN
  Filled 2011-06-20: qty 10

## 2011-06-20 MED ORDER — SODIUM CHLORIDE 0.9 % IJ SOLN
INTRAMUSCULAR | Status: AC
  Filled 2011-06-20: qty 10

## 2011-06-20 MED ORDER — HEPARIN SOD (PORK) LOCK FLUSH 100 UNIT/ML IV SOLN
500.0000 [IU] | Freq: Once | INTRAVENOUS | Status: DC
  Filled 2011-06-20: qty 5

## 2011-06-20 NOTE — Progress Notes (Signed)
Good blood return from port.  Tolerated well.

## 2011-08-01 ENCOUNTER — Encounter (HOSPITAL_COMMUNITY): Payer: 59

## 2011-08-08 ENCOUNTER — Encounter (HOSPITAL_COMMUNITY): Payer: 59 | Attending: Oncology

## 2011-08-08 ENCOUNTER — Encounter (HOSPITAL_COMMUNITY): Payer: 59

## 2011-08-08 VITALS — BP 125/69 | HR 83 | Temp 97.9°F | Wt 206.6 lb

## 2011-08-08 DIAGNOSIS — C189 Malignant neoplasm of colon, unspecified: Secondary | ICD-10-CM | POA: Insufficient documentation

## 2011-08-08 DIAGNOSIS — Z452 Encounter for adjustment and management of vascular access device: Secondary | ICD-10-CM

## 2011-08-08 LAB — DIFFERENTIAL
Basophils Absolute: 0 10*3/uL (ref 0.0–0.1)
Basophils Relative: 0 % (ref 0–1)
Eosinophils Relative: 1 % (ref 0–5)
Lymphocytes Relative: 36 % (ref 12–46)
Neutro Abs: 2.1 10*3/uL (ref 1.7–7.7)

## 2011-08-08 LAB — CBC
MCV: 86.8 fL (ref 78.0–100.0)
Platelets: 141 10*3/uL — ABNORMAL LOW (ref 150–400)
RDW: 12.5 % (ref 11.5–15.5)
WBC: 3.8 10*3/uL — ABNORMAL LOW (ref 4.0–10.5)

## 2011-08-08 LAB — CEA: CEA: 0.9 ng/mL (ref 0.0–5.0)

## 2011-08-08 MED ORDER — HEPARIN SOD (PORK) LOCK FLUSH 100 UNIT/ML IV SOLN
500.0000 [IU] | Freq: Once | INTRAVENOUS | Status: AC
  Administered 2011-08-08: 500 [IU] via INTRAVENOUS
  Filled 2011-08-08: qty 5

## 2011-08-08 MED ORDER — HEPARIN SOD (PORK) LOCK FLUSH 100 UNIT/ML IV SOLN
INTRAVENOUS | Status: AC
  Filled 2011-08-08: qty 5

## 2011-08-08 MED ORDER — SODIUM CHLORIDE 0.9 % IJ SOLN
10.0000 mL | INTRAMUSCULAR | Status: DC | PRN
  Administered 2011-08-08: 10 mL via INTRAVENOUS
  Filled 2011-08-08: qty 10

## 2011-08-08 MED ORDER — SODIUM CHLORIDE 0.9 % IJ SOLN
INTRAMUSCULAR | Status: AC
  Filled 2011-08-08: qty 10

## 2011-08-08 NOTE — Progress Notes (Signed)
Martin Marquez presented for Portacath access and flush.  Proper placement of portacath confirmed by CXR.  Portacath located left chest wall accessed with  H 20 needle.  Good blood return present and labs obtained for testing. Portacath flushed with 20ml NS and 500U/81ml Heparin and needle removed intact.  Procedure without incident.  Patient tolerated procedure well.

## 2011-08-12 ENCOUNTER — Encounter (HOSPITAL_COMMUNITY): Payer: Self-pay

## 2011-08-19 ENCOUNTER — Ambulatory Visit (INDEPENDENT_AMBULATORY_CARE_PROVIDER_SITE_OTHER): Payer: 59 | Admitting: Urology

## 2011-08-19 DIAGNOSIS — R339 Retention of urine, unspecified: Secondary | ICD-10-CM

## 2011-08-19 DIAGNOSIS — R300 Dysuria: Secondary | ICD-10-CM

## 2011-08-19 DIAGNOSIS — N400 Enlarged prostate without lower urinary tract symptoms: Secondary | ICD-10-CM

## 2011-09-12 ENCOUNTER — Encounter (HOSPITAL_COMMUNITY): Payer: 59 | Attending: Oncology

## 2011-09-12 ENCOUNTER — Encounter (HOSPITAL_COMMUNITY): Payer: 59

## 2011-09-12 DIAGNOSIS — C189 Malignant neoplasm of colon, unspecified: Secondary | ICD-10-CM | POA: Insufficient documentation

## 2011-09-12 DIAGNOSIS — Z5189 Encounter for other specified aftercare: Secondary | ICD-10-CM

## 2011-09-12 MED ORDER — HEPARIN SOD (PORK) LOCK FLUSH 100 UNIT/ML IV SOLN
500.0000 [IU] | Freq: Once | INTRAVENOUS | Status: AC
  Administered 2011-09-12: 500 [IU] via INTRAVENOUS
  Filled 2011-09-12: qty 5

## 2011-09-12 MED ORDER — HEPARIN SOD (PORK) LOCK FLUSH 100 UNIT/ML IV SOLN
INTRAVENOUS | Status: AC
  Filled 2011-09-12: qty 5

## 2011-09-12 MED ORDER — SODIUM CHLORIDE 0.9 % IJ SOLN
10.0000 mL | Freq: Once | INTRAMUSCULAR | Status: AC
  Administered 2011-09-12: 10 mL via INTRAVENOUS
  Filled 2011-09-12: qty 10

## 2011-09-12 MED ORDER — SODIUM CHLORIDE 0.9 % IJ SOLN
INTRAMUSCULAR | Status: AC
  Filled 2011-09-12: qty 10

## 2011-09-12 NOTE — Progress Notes (Signed)
Tolerated port flush well. 

## 2011-09-19 ENCOUNTER — Encounter (HOSPITAL_COMMUNITY): Payer: 59

## 2011-09-19 IMAGING — CT CT ABD-PELV W/ CM
2 of 5 series · 16 of 46 positions shown, 18 images · IV contrast (Omnipaque 300)
Comparison: None

CLINICAL DATA: Colon cancer.

CT ABDOMEN AND PELVIS WITH CONTRAST
TECHNIQUE: Multidetector CT imaging of the abdomen and pelvis was
performed following the standard protocol during bolus
administration of intravenous contrast.
Contrast: 100 ml of 2mnipaque-T55

[Series 2: abd_pel_with 5.0 b40f · axial · 0.77mm/px · z∈[+366,+786]mm · 13 of 98 slices shown, 15 images]
[im 7/98  soft-tissue]
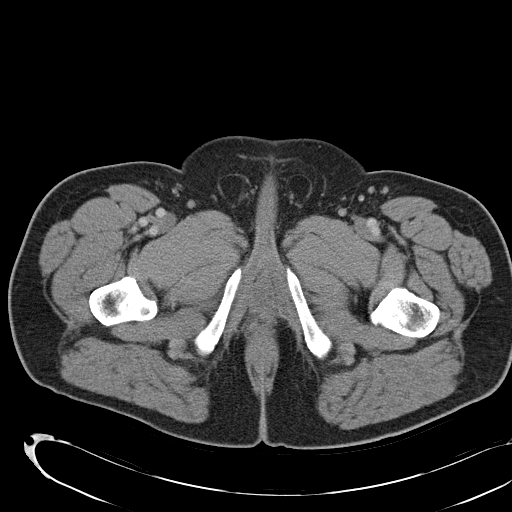
[im 7/98  bone]
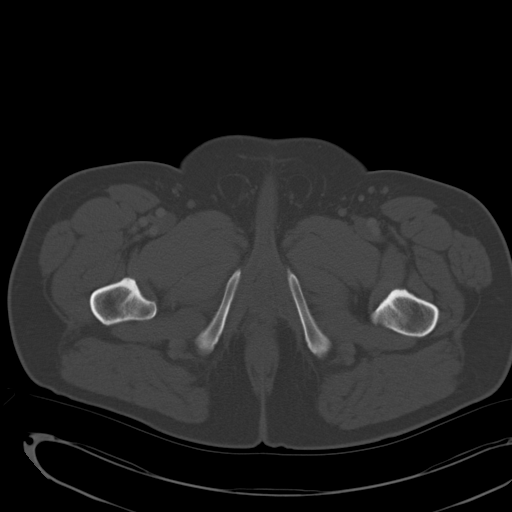
[im 13/98  soft-tissue]
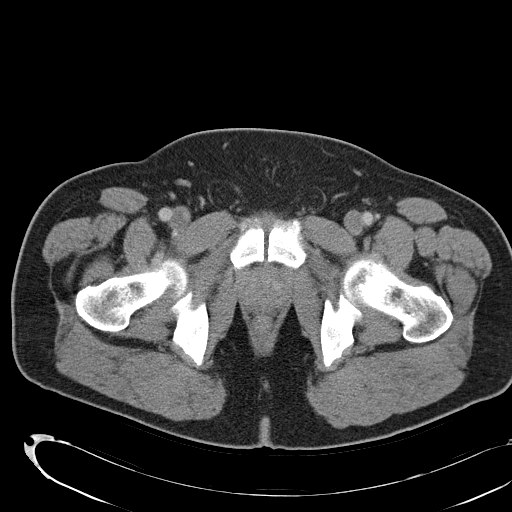
[im 19/98  soft-tissue]
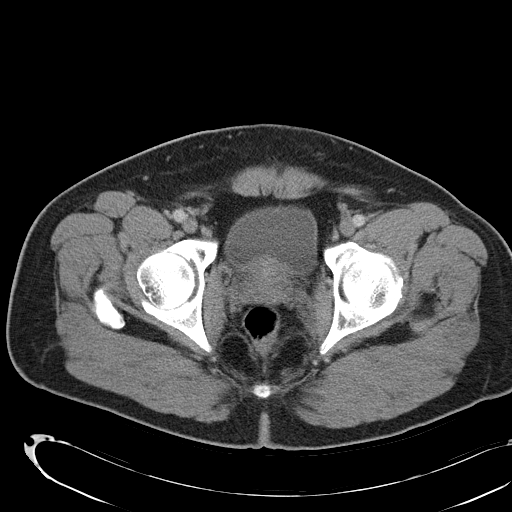
[im 31/98  soft-tissue]
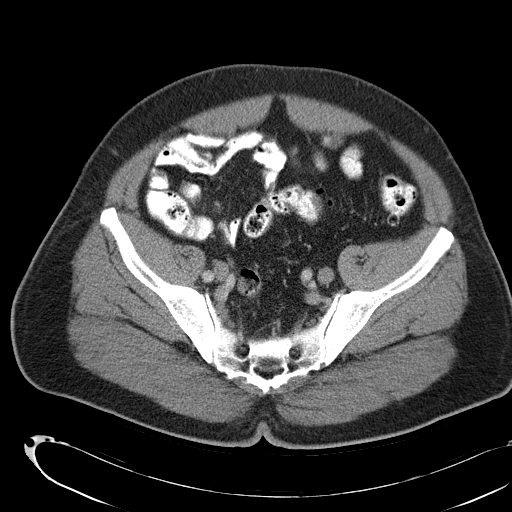
[im 37/98  soft-tissue]
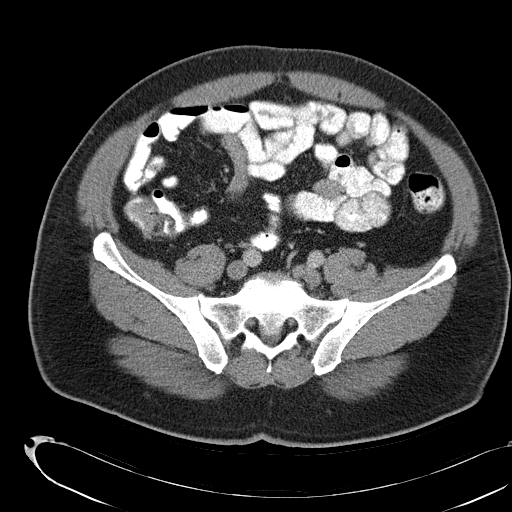
[im 43/98  soft-tissue]
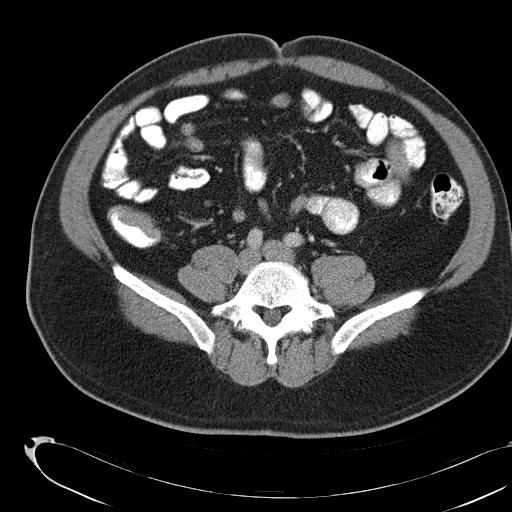
[im 49/98  soft-tissue]
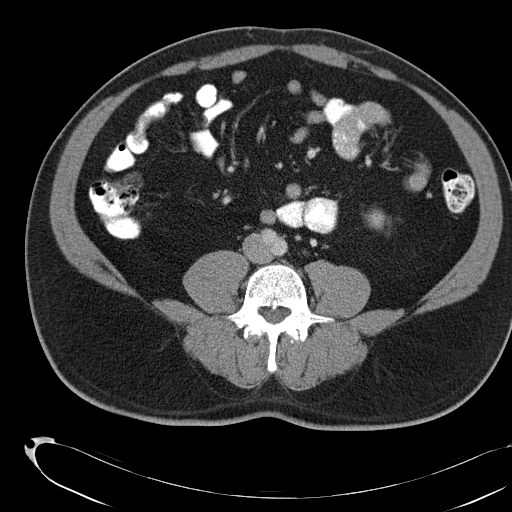
[im 55/98  soft-tissue]
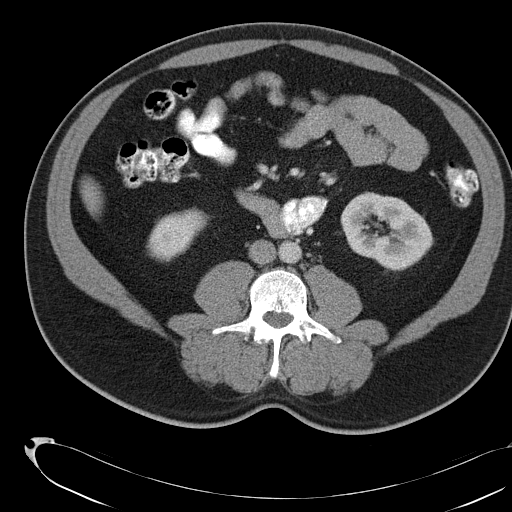
[im 61/98  soft-tissue]
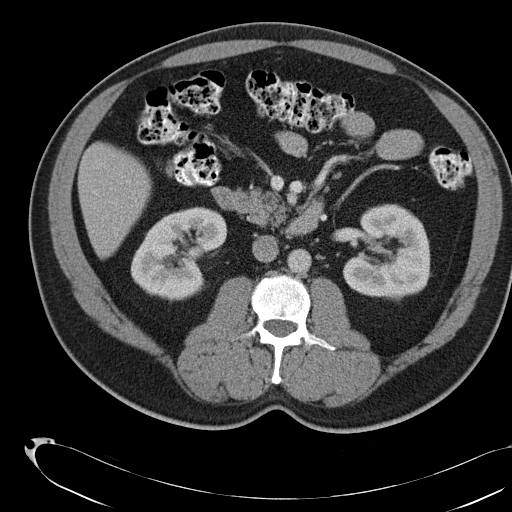
[im 61/98  bone]
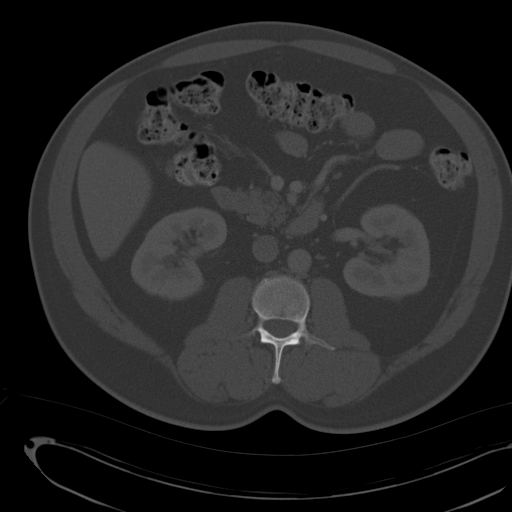
[im 67/98  soft-tissue]
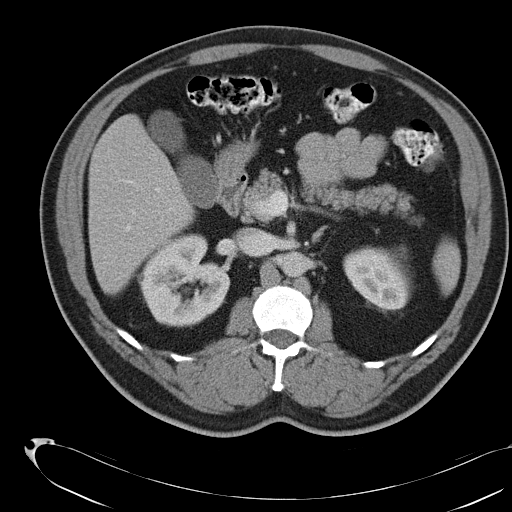
[im 79/98  soft-tissue]
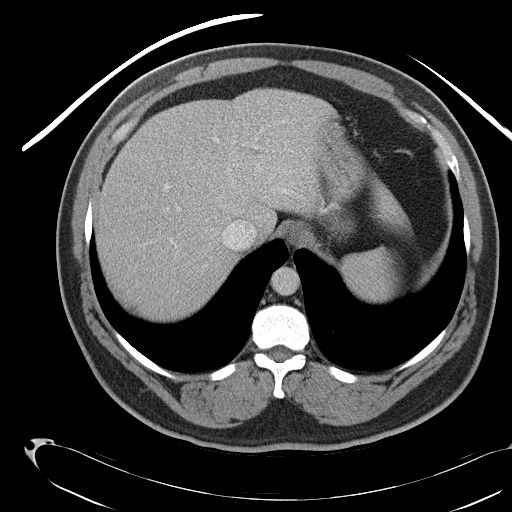
[im 85/98  soft-tissue]
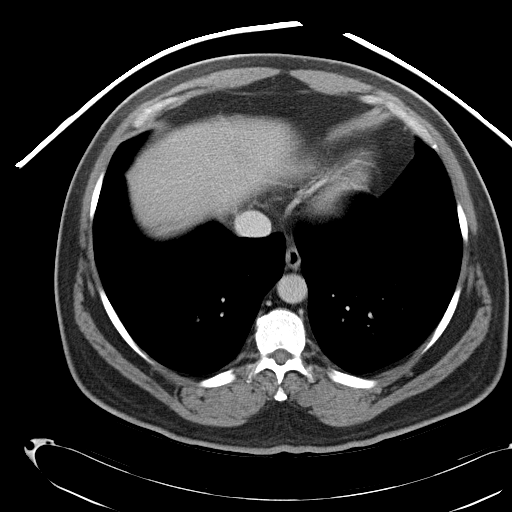
[im 91/98  soft-tissue]
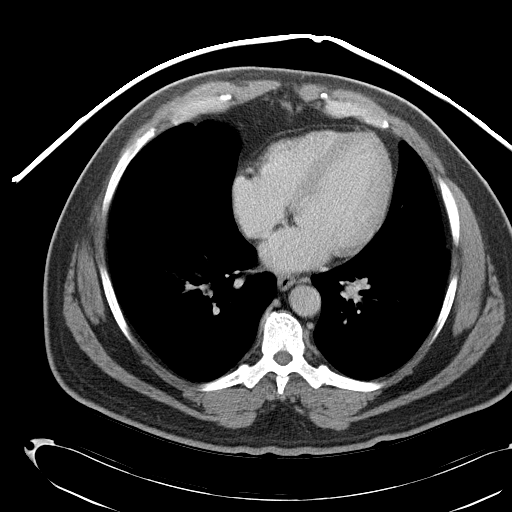

[Series 4: abd_pel_with 3.0 spo cor · coronal · 0.75mm/px · 3 of 104 slices shown]
[im 35/104  soft-tissue]
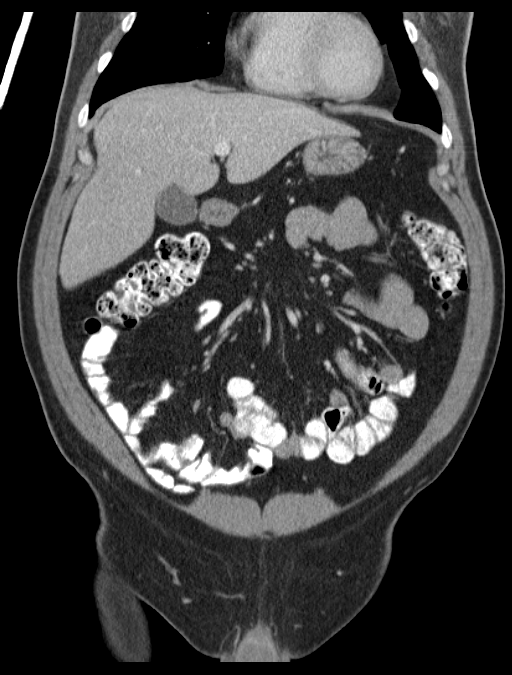
[im 46/104  soft-tissue]
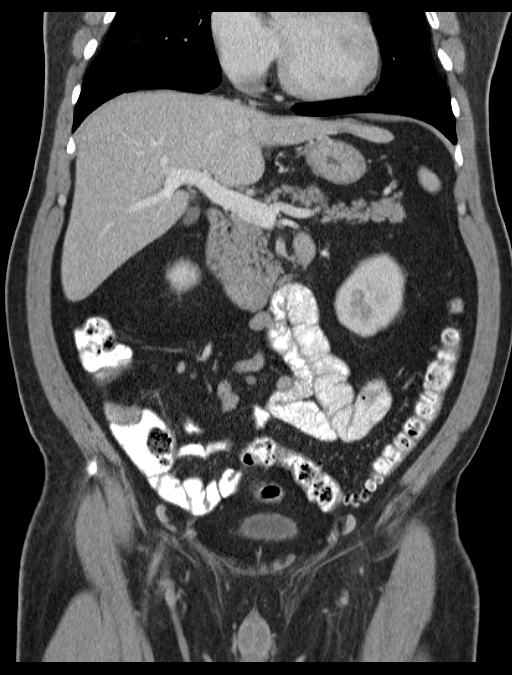
[im 58/104  soft-tissue]
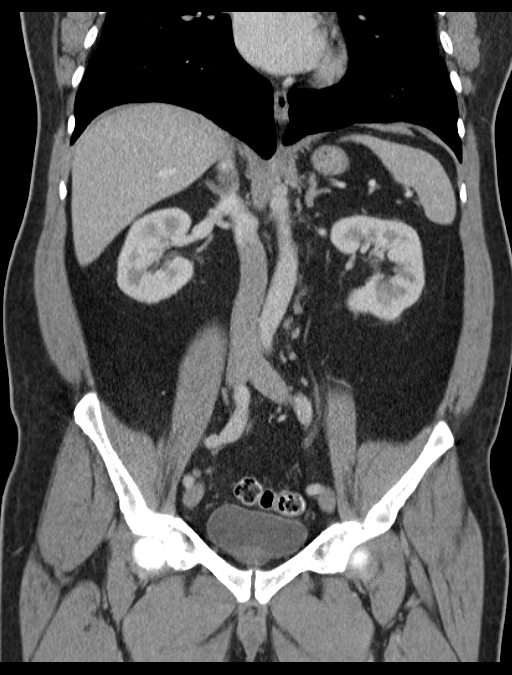

[16 of 46 positions shown; findings below may reference images not displayed]

FINDINGS: The lung bases are clear of acute findings.  There are
several tiny pulmonary nodules which require surveillance.

The liver is normal.  No findings for liver metastatic disease.
The gallbladder is normal.  No common bile duct dilatation.  The
pancreas is normal.  The spleen is normal in size.  The adrenal
glands and kidneys are normal.

The stomach, duodenum, and small bowel are unremarkable.  No
mesenteric or retroperitoneal masses or adenopathy.  There are a
few small scattered pericecal lymph nodes.  There is a soft tissue
mass in the cecum near the ileocecal valve.  This measures 4.6 x
2.8 cm.  The terminal ileum is normal.

The aorta is normal in caliber.  The major branch vessels are
normal.  Small scattered retroperitoneal lymph nodes are noted.
The remainder the colon is unremarkable.  Mild diverticulosis of
the sigmoid colon.  The appendix is normal.

The bladder, prostate gland and seminal vesicles are unremarkable.
No pelvic lymphadenopathy or free pelvic fluid collections.  No
inguinal mass or hernia.

The bony pelvis is intact.
IMPRESSION: 1.  4.6 x 2.8 cm cecal mass.
2.  Small pericecal and retroperitoneal lymph nodes.
3.  No findings for metastatic hepatic disease.
4.  Very small pulmonary nodules.  Recommend follow-up examination
to document stability.

## 2011-10-17 ENCOUNTER — Encounter (HOSPITAL_COMMUNITY): Payer: 59

## 2011-10-22 ENCOUNTER — Emergency Department (HOSPITAL_COMMUNITY)
Admission: EM | Admit: 2011-10-22 | Discharge: 2011-10-22 | Disposition: A | Discharge status: Home / Self Care | Payer: Worker's Compensation | Attending: Emergency Medicine | Admitting: Emergency Medicine

## 2011-10-22 ENCOUNTER — Encounter (HOSPITAL_COMMUNITY): Payer: Self-pay | Admitting: *Deleted

## 2011-10-22 ENCOUNTER — Emergency Department (HOSPITAL_COMMUNITY): Payer: Worker's Compensation

## 2011-10-22 DIAGNOSIS — Z85038 Personal history of other malignant neoplasm of large intestine: Secondary | ICD-10-CM | POA: Insufficient documentation

## 2011-10-22 DIAGNOSIS — M25569 Pain in unspecified knee: Secondary | ICD-10-CM | POA: Insufficient documentation

## 2011-10-22 DIAGNOSIS — I10 Essential (primary) hypertension: Secondary | ICD-10-CM | POA: Insufficient documentation

## 2011-10-22 DIAGNOSIS — R52 Pain, unspecified: Secondary | ICD-10-CM | POA: Insufficient documentation

## 2011-10-22 HISTORY — DX: Benign prostatic hyperplasia without lower urinary tract symptoms: N40.0

## 2011-10-22 HISTORY — DX: Malignant neoplasm of colon, unspecified: C18.9

## 2011-10-22 MED ORDER — METHOCARBAMOL 500 MG PO TABS: 1000.0000 mg | ORAL_TABLET | Freq: Four times a day (QID) | ORAL | Status: AC

## 2011-10-22 MED ORDER — IBUPROFEN 200 MG PO TABS
600.0000 mg | ORAL_TABLET | Freq: Once | ORAL | Status: AC
  Administered 2011-10-22: 600 mg via ORAL
  Filled 2011-10-22: qty 3

## 2011-10-22 MED ORDER — IBUPROFEN 600 MG PO TABS: 600.0000 mg | ORAL_TABLET | Freq: Four times a day (QID) | ORAL | Status: AC | PRN
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
## 2011-10-22 NOTE — ED Notes (Signed)
Patient taken to xray.
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
## 2011-10-23 ENCOUNTER — Encounter (HOSPITAL_COMMUNITY): Payer: Self-pay | Admitting: General Surgery

## 2011-10-24 ENCOUNTER — Encounter (HOSPITAL_COMMUNITY): Payer: 59 | Attending: Oncology

## 2011-10-24 DIAGNOSIS — C189 Malignant neoplasm of colon, unspecified: Secondary | ICD-10-CM | POA: Insufficient documentation

## 2011-10-24 LAB — CBC
Hemoglobin: 14.9 g/dL (ref 13.0–17.0)
MCHC: 36.1 g/dL — ABNORMAL HIGH (ref 30.0–36.0)
Platelets: 146 10*3/uL — ABNORMAL LOW (ref 150–400)

## 2011-10-24 LAB — CEA: CEA: 1.2 ng/mL (ref 0.0–5.0)

## 2011-10-24 MED ORDER — HEPARIN SOD (PORK) LOCK FLUSH 100 UNIT/ML IV SOLN
INTRAVENOUS | Status: AC
  Filled 2011-10-24: qty 5

## 2011-10-24 MED ORDER — SODIUM CHLORIDE 0.9 % IJ SOLN
INTRAMUSCULAR | Status: AC
  Filled 2011-10-24: qty 10

## 2011-10-24 MED ORDER — HEPARIN SOD (PORK) LOCK FLUSH 100 UNIT/ML IV SOLN
500.0000 [IU] | Freq: Once | INTRAVENOUS | Status: AC
  Administered 2011-10-24: 500 [IU] via INTRAVENOUS
  Filled 2011-10-24: qty 5

## 2011-10-24 MED ORDER — SODIUM CHLORIDE 0.9 % IJ SOLN
10.0000 mL | INTRAMUSCULAR | Status: DC | PRN
  Administered 2011-10-24: 10 mL via INTRAVENOUS
  Filled 2011-10-24: qty 10

## 2011-10-24 NOTE — Progress Notes (Signed)
Martin Marquez presented for Portacath access and flush. Proper placement of portacath confirmed by CXR. Portacath located left chest wall accessed with  H 20 needle. Good blood return present. Portacath flushed with 20ml NS and 500U/5ml Heparin and needle removed intact. Procedure without incident. Patient tolerated procedure well.   

## 2011-10-25 LAB — COMPREHENSIVE METABOLIC PANEL
BUN: 17 mg/dL (ref 6–23)
Calcium: 9.3 mg/dL (ref 8.4–10.5)
Creatinine, Ser: 0.65 mg/dL (ref 0.50–1.35)
GFR calc Af Amer: >90 mL/min (ref >90)
GFR calc non Af Amer: >90 mL/min (ref >90)
Glucose, Bld: 109 mg/dL — ABNORMAL HIGH (ref 70–99)
Potassium: 4.3 mEq/L (ref 3.5–5.1)
Sodium: 140 mEq/L (ref 135–145)
Total Bilirubin: 0.2 mg/dL — ABNORMAL LOW (ref 0.3–1.2)

## 2011-10-28 ENCOUNTER — Ambulatory Visit (INDEPENDENT_AMBULATORY_CARE_PROVIDER_SITE_OTHER): Payer: 59 | Admitting: Urology

## 2011-10-28 DIAGNOSIS — N400 Enlarged prostate without lower urinary tract symptoms: Secondary | ICD-10-CM

## 2011-10-28 DIAGNOSIS — R339 Retention of urine, unspecified: Secondary | ICD-10-CM

## 2011-10-28 DIAGNOSIS — R300 Dysuria: Secondary | ICD-10-CM

## 2011-10-31 ENCOUNTER — Encounter (HOSPITAL_COMMUNITY): Payer: 59

## 2011-11-14 ENCOUNTER — Encounter (HOSPITAL_COMMUNITY): Payer: Self-pay | Admitting: Oncology

## 2011-11-14 ENCOUNTER — Ambulatory Visit (HOSPITAL_COMMUNITY): Payer: 59 | Admitting: Oncology

## 2011-11-21 ENCOUNTER — Encounter (HOSPITAL_COMMUNITY): Payer: 59

## 2011-12-05 ENCOUNTER — Encounter (HOSPITAL_BASED_OUTPATIENT_CLINIC_OR_DEPARTMENT_OTHER): Payer: 59 | Admitting: Oncology

## 2011-12-05 ENCOUNTER — Encounter (HOSPITAL_COMMUNITY): Payer: 59 | Attending: Oncology

## 2011-12-05 VITALS — BP 126/76 | HR 61 | Temp 97.8°F | Resp 18 | Wt 202.0 lb

## 2011-12-05 DIAGNOSIS — G609 Hereditary and idiopathic neuropathy, unspecified: Secondary | ICD-10-CM

## 2011-12-05 DIAGNOSIS — C189 Malignant neoplasm of colon, unspecified: Secondary | ICD-10-CM | POA: Insufficient documentation

## 2011-12-05 DIAGNOSIS — E669 Obesity, unspecified: Secondary | ICD-10-CM

## 2011-12-05 DIAGNOSIS — C779 Secondary and unspecified malignant neoplasm of lymph node, unspecified: Secondary | ICD-10-CM

## 2011-12-05 MED ORDER — HEPARIN SOD (PORK) LOCK FLUSH 100 UNIT/ML IV SOLN
INTRAVENOUS | Status: AC
  Filled 2011-12-05: qty 5

## 2011-12-05 MED ORDER — HEPARIN SOD (PORK) LOCK FLUSH 100 UNIT/ML IV SOLN
500.0000 [IU] | Freq: Once | INTRAVENOUS | Status: AC
  Administered 2011-12-05: 500 [IU] via INTRAVENOUS
  Filled 2011-12-05: qty 5

## 2011-12-05 NOTE — Progress Notes (Unsigned)
Shelton G Whitenack presented for Portacath access and flush. Proper placement of portacath confirmed by CXR. Portacath located lt chest wall accessed with  H 20 needle. Good blood return present. Portacath flushed with 20ml NS and 500U/5ml Heparin and needle removed intact. Procedure without incident. Patient tolerated procedure well.   
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
## 2011-12-05 NOTE — Patient Instructions (Addendum)
[facility]  Discharge Instructions Martin Marquez  DOB 1955/06/03 CSN [id number]  MRN [id number] Dr. Glenford Peers    RECOMMENDATIONS MADE BY THE CONSULTANT AND ANY TEST RESULTS WILL BE SENT TO YOUR REFERRING DOCTOR.   EXAM FINDINGS BY MD TODAY AND SIGNS AND SYMPTOMS TO REPORT TO CLINIC OR PRIMARY MD:   LABS: CEA every 12 weeks  In 6 months, CBC diff, CMET, CEA  In 6 months, CT scan of abdomen/pelvis - will need to drink oral contrast - do not forget to pick up closer to ct appointment  Return to see [name] after CT scan in 6 months:   Port may be removed by Dr. Leticia Penna         I acknowledge that I have been informed and understand all the instructions given to me and received a copy. I do not have any more questions at this time, but understand that I may call the Specialty Clinic at [facility] at [phone] during business hours should I have any further questions or need assistance in obtaining follow-up care.    __________________________________________  _____________  __________ Signature of Patient or Authorized Representative            Date                   Time    __________________________________________ Nurse's Signature

## 2011-12-12 ENCOUNTER — Encounter (HOSPITAL_COMMUNITY): Payer: 59

## 2011-12-23 ENCOUNTER — Encounter (HOSPITAL_COMMUNITY): Payer: Self-pay | Admitting: Pharmacy Technician

## 2011-12-26 ENCOUNTER — Ambulatory Visit (HOSPITAL_COMMUNITY)
Admission: RE | Admit: 2011-12-26 | Discharge: 2011-12-26 | Disposition: A | Discharge status: Home / Self Care | Payer: 59 | Source: Ambulatory Visit | Attending: General Surgery | Admitting: General Surgery

## 2011-12-26 ENCOUNTER — Encounter (HOSPITAL_COMMUNITY)
Admission: RE | Disposition: A | Discharge status: Home / Self Care | Payer: Self-pay | Source: Ambulatory Visit | Attending: General Surgery

## 2011-12-26 ENCOUNTER — Encounter (HOSPITAL_COMMUNITY): Payer: Self-pay | Admitting: *Deleted

## 2011-12-26 ENCOUNTER — Encounter (HOSPITAL_COMMUNITY): Payer: 59

## 2011-12-26 DIAGNOSIS — Z452 Encounter for adjustment and management of vascular access device: Secondary | ICD-10-CM | POA: Insufficient documentation

## 2011-12-26 DIAGNOSIS — I10 Essential (primary) hypertension: Secondary | ICD-10-CM | POA: Insufficient documentation

## 2011-12-26 DIAGNOSIS — C189 Malignant neoplasm of colon, unspecified: Secondary | ICD-10-CM | POA: Insufficient documentation

## 2011-12-26 HISTORY — PX: PORT-A-CATH REMOVAL: SHX5289

## 2011-12-26 SURGERY — REMOVAL PORT-A-CATH
Anesthesia: LOCAL

## 2011-12-26 MED ORDER — LIDOCAINE HCL (PF) 1 % IJ SOLN
INTRAMUSCULAR | Status: AC
  Filled 2011-12-26: qty 30

## 2011-12-26 MED ORDER — LIDOCAINE HCL (PF) 1 % IJ SOLN
INTRAMUSCULAR | Status: DC | PRN
  Administered 2011-12-26: 15 mg

## 2011-12-26 MED ORDER — HYDROCODONE-ACETAMINOPHEN 5-325 MG PO TABS: 1.0000 | ORAL_TABLET | ORAL | Status: DC | PRN

## 2011-12-26 SURGICAL SUPPLY — 21 items
BAG HAMPER (MISCELLANEOUS) IMPLANT
CLOTH BEACON ORANGE TIMEOUT ST (SAFETY) IMPLANT
COVER LIGHT HANDLE STERIS (MISCELLANEOUS) IMPLANT
DURAPREP 26ML APPLICATOR (WOUND CARE) IMPLANT
ELECT REM PT RETURN 9FT ADLT (ELECTROSURGICAL)
ELECTRODE REM PT RTRN 9FT ADLT (ELECTROSURGICAL) IMPLANT
GLOVE BIOGEL PI IND STRL 7.5 (GLOVE) IMPLANT
GLOVE BIOGEL PI INDICATOR 7.5 (GLOVE)
GOWN STRL REIN XL XLG (GOWN DISPOSABLE) IMPLANT
KIT ROOM TURNOVER APOR (KITS) IMPLANT
MANIFOLD NEPTUNE II (INSTRUMENTS) IMPLANT
NEEDLE HYPO 25X1 1.5 SAFETY (NEEDLE) IMPLANT
NS IRRIG 1000ML POUR BTL (IV SOLUTION) IMPLANT
PACK MINOR (CUSTOM PROCEDURE TRAY) IMPLANT
PAD ARMBOARD 7.5X6 YLW CONV (MISCELLANEOUS) IMPLANT
SET BASIN LINEN APH (SET/KITS/TRAYS/PACK) IMPLANT
SPONGE GAUZE 2X2 8PLY STRL LF (GAUZE/BANDAGES/DRESSINGS) IMPLANT
STRIP CLOSURE SKIN 1/4X3 (GAUZE/BANDAGES/DRESSINGS) IMPLANT
SUT MNCRL AB 4-0 PS2 18 (SUTURE) IMPLANT
SYR CONTROL 10ML LL (SYRINGE) IMPLANT
TOWEL OR 17X26 4PK STRL BLUE (TOWEL DISPOSABLE) IMPLANT

## 2011-12-26 NOTE — Op Note (Signed)
Patient:  Martin Marquez  DOB:  [birth year]  MRN:  [id number]   Preop Diagnosis:  Colon cancer  Postop Diagnosis:  The same  Procedure:  Removal of power port  Surgeon:  Dr. Tilford Pillar  Anes:  1% lidocaine for local  Indications:  Patient is a 56 year old male known to me with a history of colon cancer. He's completed all his courses of chemotherapy. At this time he's oncologist is.feels he will require any additional treatments. He's completed all uses for the port at this time wishes to have a removed. Risks benefits alternatives were discussed. His questions concerns addressed. Patient was consented for the planned procedure  Procedure note:  Patient is taken to the minor procedure room. His left chest wall was prepped with chlorhexidine solution and draped in standard fashion. Local anesthetic is instilled. A 15 blade scalpel was utilized to incise over the previous port site scar. The subcuticular capsule is entered into sharply with a combination of the scalpel and Metzenbaum dissection. The port is free from the surrounding capsule and freed from the anterior chest wall. At this point it is removed intact. It was disposed of. Pressure was held under the clavicle for proximally 5 minutes. Hemostasis is excellent. The skin edges reapproximated using a 4-0 Monocryl in a running subcuticular suture. The skin was washed dried moist dry towel. Benzoin is applied around incision. Half-inch are suture placed. Patient tolerated procedure she well. All sharps were disposed of in proper accordance.  Complications:  None apparent  EBL:  Minimal  Specimen:  None

## 2011-12-26 NOTE — H&P (Signed)
Martin Marquez is an 56 y.o. male.   Chief Complaint: Colon cancer HPI: Patient is well-known to me with a history of colon cancer. He has previously undergone colon resection and port placement for chemotherapy treatments. At this time patient has completed all planned treatments. He no longer requires his Port-A-Cath and wishes to have it removed. He has had no difficulty with a Port-A-Cath. It has been accessed during his treatment without difficulties.  Past Medical History  Diagnosis Date  . Complication of anesthesia   . Cancer     colon / resection/ chemo  . UTI (lower urinary tract infection)   . BPH (benign prostatic hyperplasia)   . Hypertension     stopped bp meds after chemo  . Hypertension   . Enlarged prostate   . Colon cancer     Past Surgical History  Procedure Date  . Colon resection     2011-aph  . Colonoscopy 11/01/2010    Procedure: COLONOSCOPY;  Surgeon: Dalia Heading;  Location: [facility];  Service: Gastroenterology;  Laterality: N/A;  . Colon surgery   . Port a cath insertion     Family History  Problem Relation Age of Onset  . Anesthesia problems Neg Hx   . Hypotension Neg Hx   . Malignant hyperthermia Neg Hx   . Pseudochol deficiency Neg Hx    Social History:  reports that he has never smoked. He does not have any smokeless tobacco history on file. He reports that he does not drink alcohol or use illicit drugs.  Allergies: No Known Allergies  No prescriptions prior to admission    No results found for this or any previous visit (from the past 48 hour(s)). No results found.  Review of Systems  HENT: Negative.   Eyes: Negative.   Respiratory: Negative.   Cardiovascular: Negative.   Gastrointestinal: Negative.   Genitourinary: Negative.   Musculoskeletal: Negative.   Skin: Negative.   Neurological: Negative.   Endo/Heme/Allergies: Negative.   Psychiatric/Behavioral: Negative.     There were no vitals taken for this visit. Physical  Exam  Constitutional: He is oriented to person, place, and time. He appears well-developed and well-nourished.  HENT:  Head: Normocephalic.  Eyes: Conjunctivae normal are normal. Pupils are equal, round, and reactive to light.  Neck: Normal range of motion.  Cardiovascular: Normal rate, regular rhythm and normal heart sounds.   Respiratory: Effort normal and breath sounds normal. No respiratory distress.  GI: Soft.  Lymphadenopathy:    He has no cervical adenopathy.  Neurological: He is alert and oriented to person, place, and time.     Assessment/Plan History of colon cancer. Patient is now completed all planned chemotherapy and treatments at this time. Patient has a Port-A-Cath and this will be removed. He'll be scheduled in the minor procedure room to have the port removed at his convenience.  Jolee Critcher C 12/26/2011, 5:42 AM

## 2011-12-31 ENCOUNTER — Encounter (HOSPITAL_COMMUNITY): Payer: Self-pay | Admitting: General Surgery

## 2012-03-05 ENCOUNTER — Encounter (HOSPITAL_COMMUNITY): Payer: 59 | Attending: Oncology

## 2012-03-05 DIAGNOSIS — C189 Malignant neoplasm of colon, unspecified: Secondary | ICD-10-CM | POA: Insufficient documentation

## 2012-03-05 LAB — CEA: CEA: 1.3 ng/mL (ref 0.0–5.0)

## 2012-03-05 NOTE — Progress Notes (Signed)
Labs drawn today for cea 

## 2012-05-19 ENCOUNTER — Encounter (HOSPITAL_COMMUNITY): Payer: 59 | Attending: Oncology

## 2012-05-19 DIAGNOSIS — C189 Malignant neoplasm of colon, unspecified: Secondary | ICD-10-CM

## 2012-05-19 LAB — COMPREHENSIVE METABOLIC PANEL
ALT: 47 U/L (ref 0–53)
AST: 33 U/L (ref 0–37)
CO2: 26 mEq/L (ref 19–32)
Calcium: 9.5 mg/dL (ref 8.4–10.5)
Chloride: 103 mEq/L (ref 96–112)
GFR calc non Af Amer: >90 mL/min (ref >90)
Potassium: 3.9 mEq/L (ref 3.5–5.1)
Sodium: 139 mEq/L (ref 135–145)

## 2012-05-19 LAB — CBC WITH DIFFERENTIAL/PLATELET
Basophils Absolute: 0 10*3/uL (ref 0.0–0.1)
Eosinophils Relative: 1 % (ref 0–5)
Lymphocytes Relative: 32 % (ref 12–46)
Neutro Abs: 2.8 10*3/uL (ref 1.7–7.7)
Neutrophils Relative %: 57 % (ref 43–77)
Platelets: 171 10*3/uL (ref 150–400)
RDW: 12.6 % (ref 11.5–15.5)
WBC: 5 10*3/uL (ref 4.0–10.5)

## 2012-05-19 NOTE — Progress Notes (Unsigned)
Martin Marquez presented for labwork. Labs per MD order drawn via Peripheral Line 23 gauge needle inserted in right AC  Good blood return present. Procedure without incident.  Needle removed intact. Patient tolerated procedure well.

## 2012-05-21 ENCOUNTER — Ambulatory Visit (HOSPITAL_COMMUNITY)
Admission: RE | Admit: 2012-05-21 | Discharge: 2012-05-21 | Disposition: A | Discharge status: Home / Self Care | Payer: 59 | Source: Ambulatory Visit | Attending: Oncology | Admitting: Oncology

## 2012-05-21 DIAGNOSIS — K5730 Diverticulosis of large intestine without perforation or abscess without bleeding: Secondary | ICD-10-CM | POA: Insufficient documentation

## 2012-05-21 DIAGNOSIS — Z9049 Acquired absence of other specified parts of digestive tract: Secondary | ICD-10-CM | POA: Insufficient documentation

## 2012-05-21 DIAGNOSIS — C189 Malignant neoplasm of colon, unspecified: Secondary | ICD-10-CM | POA: Insufficient documentation

## 2012-05-21 DIAGNOSIS — Z9221 Personal history of antineoplastic chemotherapy: Secondary | ICD-10-CM | POA: Insufficient documentation

## 2012-05-21 MED ORDER — IOHEXOL 300 MG/ML  SOLN
100.0000 mL | Freq: Once | INTRAMUSCULAR | Status: AC | PRN
  Administered 2012-05-21: 100 mL via INTRAVENOUS

## 2012-05-28 ENCOUNTER — Ambulatory Visit (HOSPITAL_COMMUNITY): Payer: Self-pay | Admitting: Oncology

## 2012-06-03 ENCOUNTER — Other Ambulatory Visit (HOSPITAL_COMMUNITY): Payer: Self-pay

## 2012-06-04 ENCOUNTER — Encounter (HOSPITAL_COMMUNITY): Payer: Self-pay | Admitting: Oncology

## 2012-06-04 ENCOUNTER — Encounter (HOSPITAL_COMMUNITY): Payer: 59 | Attending: Oncology | Admitting: Oncology

## 2012-06-04 VITALS — BP 109/65 | HR 84 | Temp 98.3°F | Resp 16 | Wt 208.4 lb

## 2012-06-04 DIAGNOSIS — C189 Malignant neoplasm of colon, unspecified: Secondary | ICD-10-CM | POA: Insufficient documentation

## 2012-06-04 DIAGNOSIS — Z85038 Personal history of other malignant neoplasm of large intestine: Secondary | ICD-10-CM

## 2012-06-04 MED ORDER — TAMSULOSIN HCL 0.4 MG PO CAPS: 0.4000 mg | ORAL_CAPSULE | Freq: Two times a day (BID) | ORAL | Status: DC

## 2012-06-04 NOTE — Patient Instructions (Addendum)
[facility] Discharge Instructions  RECOMMENDATIONS MADE BY THE CONSULTANT AND ANY TEST RESULTS WILL BE SENT TO YOUR REFERRING PHYSICIAN.  EXAM FINDINGS BY THE PHYSICIAN TODAY AND SIGNS OR SYMPTOMS TO REPORT TO CLINIC OR PRIMARY PHYSICIAN: Exam and discussion by MD.  Need to lose weight due to your fatty liver.  Will make consultation with dietitian for weight loss help.  She will call you to schedule an appointment.  CEA was normal. Scan did not show any evidence of recurrence.  MEDICATIONS PRESCRIBED:  none  INSTRUCTIONS GIVEN AND DISCUSSED: Report changes in bowel habits, blood in stool, or other issues.  SPECIAL INSTRUCTIONS/FOLLOW-UP: Blood work every 3 months and to be seen in follow-up in September.  Thank you for choosing [facility] to provide your oncology and hematology care.  To afford each patient quality time with our providers, please arrive at least 15 minutes before your scheduled appointment time.  With your help, our goal is to use those 15 minutes to complete the necessary work-up to ensure our physicians have the information they need to help with your evaluation and healthcare recommendations.    Effective January 1st, 2014, we ask that you re-schedule your appointment with our physicians should you arrive 10 or more minutes late for your appointment.  We strive to give you quality time with our providers, and arriving late affects you and other patients whose appointments are after yours.    Again, thank you for choosing [facility].  Our hope is that these requests will decrease the amount of time that you wait before being seen by our physicians.       _____________________________________________________________  Should you have questions after your visit to [facility], please contact our office at [phone] between the hours of 8:30 a.m. and 5:00 p.m.  Voicemails left after 4:30 p.m. will not be  returned until the following business day.  For prescription refill requests, have your pharmacy contact our office with your prescription refill request.

## 2012-06-04 NOTE — Progress Notes (Signed)
Problem #1 stage III adenocarcinoma right colon, 4.5 cm in size, invading into the muscularis propria and into the pericolonic fat. No LV I was found but 3 of 14 positive nodes were found for metastatic disease. His date of surgery was 10/01/2009. He has now completed 6 cycles of adjuvant chemotherapy with oxaliplatinum and capecitabine with his last treatment on the fourth 2012. He is here for routine followup. CAT scan in March showed no evidence for recurrent disease;  CEAs have remained within the normal range. #2 fatty infiltration of his liver #3 obesity #4 vitiligo  He is back to work full-time feels well but is definitely overweight. He has lost 5 pounds in a year but needs to be close to 160-165 pounds he has no trouble with his bowels. He still has some trouble with slow urination. He is seen Dr. Retta Diones in consultation. He cannot remember the name of the medication that he was placed on but it did not work any better he states than the Flomax. He'll he takes Flomax once a day and still has some difficulty urinating so we will try him on a twice a day schedule to see if that helps.  His wife is with him today she states he's done well otherwise except for the dietary intake of lots of carbohydrates. His oncology review of systems remains negative  BP 109/65  Pulse 84  Temp(Src) 98.3 F (36.8 C) (Oral)  Resp 16  Wt 208 lb 6.4 oz (94.53 kg)  BMI 30.76 kg/m2  He is in no acute distress. His hair is regrowing. He has no adenopathy. His lungs are clear to auscultation and percussion. Skin exam is negative. Old Port-A-Cath site is intact. Heart shows a regular rhythm and rate without murmur rub or gallop. Abdomen is soft and nontender without obvious hepatosplenomegaly. He has normal bowel sounds. No leg or arm edema. He has no thyromegaly. Facial symmetry appears intact.  He looks great we'll see him back every 3 months for a CEA. We will start using his last date of lab work which was  05/19/2012. He needs his cmet only every 6 months and we will do a CAT scan once a year on him. His CEA is however need be done every 3 months. We will get him back on a March/September schedule

## 2012-06-07 ENCOUNTER — Telehealth (HOSPITAL_COMMUNITY): Payer: Self-pay | Admitting: Dietician

## 2012-06-07 NOTE — Telephone Encounter (Signed)
Called and left message on voicemail at [phone].

## 2012-06-07 NOTE — Telephone Encounter (Signed)
Received referral via [facility] from [facility] (Dr. Mariel Sleet) for dx: obesity.

## 2012-06-14 NOTE — Telephone Encounter (Signed)
Sent letter to pt home via [employer] Mail in attempt to contact pt to schedule appointment.  

## 2012-06-17 NOTE — Telephone Encounter (Signed)
Pt has not responded to attempts to contact to schedule appointment. Referral filed.  

## 2012-08-06 ENCOUNTER — Encounter (HOSPITAL_COMMUNITY): Payer: 59 | Attending: Hematology and Oncology

## 2012-08-06 DIAGNOSIS — C189 Malignant neoplasm of colon, unspecified: Secondary | ICD-10-CM | POA: Insufficient documentation

## 2012-08-06 NOTE — Progress Notes (Signed)
Labs drawn today for cea 

## 2012-08-11 ENCOUNTER — Other Ambulatory Visit (HOSPITAL_COMMUNITY): Payer: Self-pay

## 2012-09-02 ENCOUNTER — Other Ambulatory Visit (HOSPITAL_COMMUNITY): Payer: Self-pay

## 2012-11-03 ENCOUNTER — Other Ambulatory Visit (HOSPITAL_COMMUNITY): Payer: Self-pay

## 2012-11-04 ENCOUNTER — Ambulatory Visit (HOSPITAL_COMMUNITY): Payer: Self-pay | Admitting: Oncology

## 2012-11-05 ENCOUNTER — Encounter (HOSPITAL_COMMUNITY): Payer: 59 | Attending: Hematology and Oncology

## 2012-11-05 DIAGNOSIS — C182 Malignant neoplasm of ascending colon: Secondary | ICD-10-CM

## 2012-11-05 DIAGNOSIS — C189 Malignant neoplasm of colon, unspecified: Secondary | ICD-10-CM

## 2012-11-05 LAB — COMPREHENSIVE METABOLIC PANEL
ALT: 43 U/L (ref 0–53)
AST: 24 U/L (ref 0–37)
CO2: 26 mEq/L (ref 19–32)
Chloride: 102 mEq/L (ref 96–112)
GFR calc non Af Amer: >90 mL/min (ref >90)
Sodium: 137 mEq/L (ref 135–145)
Total Bilirubin: 0.4 mg/dL (ref 0.3–1.2)

## 2012-11-05 NOTE — Progress Notes (Signed)
Labs drawn today for cea 

## 2012-11-06 LAB — CEA: CEA: 1.4 ng/mL (ref 0.0–5.0)

## 2012-11-12 ENCOUNTER — Ambulatory Visit (HOSPITAL_COMMUNITY): Payer: Self-pay | Admitting: Oncology

## 2012-11-12 ENCOUNTER — Other Ambulatory Visit (HOSPITAL_COMMUNITY): Payer: Self-pay

## 2012-12-03 ENCOUNTER — Other Ambulatory Visit (HOSPITAL_COMMUNITY): Payer: Self-pay

## 2012-12-03 ENCOUNTER — Encounter (HOSPITAL_COMMUNITY): Payer: Self-pay | Admitting: Oncology

## 2012-12-03 ENCOUNTER — Ambulatory Visit (HOSPITAL_COMMUNITY): Payer: Self-pay | Admitting: Oncology

## 2012-12-03 DIAGNOSIS — IMO0002 Reserved for concepts with insufficient information to code with codable children: Secondary | ICD-10-CM

## 2012-12-03 HISTORY — DX: Reserved for concepts with insufficient information to code with codable children: IMO0002

## 2012-12-03 NOTE — Progress Notes (Signed)
-  No show, letter sent-   

## 2012-12-10 ENCOUNTER — Ambulatory Visit (HOSPITAL_COMMUNITY): Payer: Self-pay | Admitting: Oncology

## 2014-05-17 NOTE — H&P (Signed)
  NTS SOAP Note  Vital Signs:  Vitals as of: 03/17/7260: Systolic 035: Diastolic 76: Heart Rate 66: Temp 97.96F: Height 29ft 9in: Weight 215Lbs 0 Ounces: BMI 31.75  BMI : 31.75 kg/m2  Subjective: This 59 year old male presents forscheduling of follow up TCS.  Has h/o colon cancer.  Denies any gi complaints at this time.   Review of Symptoms:  Constitutional:unremarkable   Head:unremarkable Eyes:unremarkable   Nose/Mouth/Throat:unremarkable Cardiovascular:  unremarkable Respiratory:unremarkable Genitourinary:dysuria, urinary hesitancy Musculoskeletal:unremarkable Skin:unremaunremarkablerkable Hematolgic/Lymphatic:unremarkable   Allergic/Immunologic:unremarkable   Past Medical History:  Reviewed  Past Medical History  Surgical History: Lap right partial colectomy 8/11,  Medical Problems:  High Blood pressure, BPH Allergies: nkda Medications: metoprolol, flomax   Social History:Reviewed  Social History  Preferred Language: English ([place]) Race:  American [place] or [place] Native Ethnicity: Not Hispanic / Latino Age: 28 Years 10 Months Marital Status:  S Alcohol:  No Recreational drug(s):  No   Smoking Status: Never smoker reviewed on 05/04/2014 Functional Status reviewed on 05/04/2014 ------------------------------------------------ Bathing: Normal Cooking: Normal Dressing: Normal Driving: Normal Eating: Normal Managing Meds: Normal Oral Care: Normal Shopping: Normal Toileting: Normal Transferring: Normal Walking: Normal Cognitive Status reviewed on 05/04/2014 ------------------------------------------------ Attention: Normal Decision Making: Normal Language: Normal Memory: Normal Motor: Normal Perception: Normal Problem Solving: Normal Visual and Spatial: Normal   Family History:Reviewed  Family Health History Mother, Healthy;  Father, Healthy;     Objective Information: General:Well appearing, well nourished  in no distress. Heart:RRR, no murmur Lungs:  CTA bilaterally, no wheezes, rhonchi, rales.  Breathing unlabored. Abdomen:Soft, NT/ND, no HSM, no masses. deferred to procedure  Assessment:Colon carcinoma  Diagnoses: V10.00  Z85.00 History of malignant neoplasm of gastrointestinal tract (Personal history of malignant neoplasm of unspecified digestive organ)  Procedures: 59741 - OFFICE OUTPATIENT NEW 20 MINUTES    Plan:  Scheduled for TCS on 06/13/14.   Patient Education:Alternative treatments to surgery were discussed with patient (and family).  Risks and benefits  of procedure including bleeding and perforation were fully explained to the patient (and family) who gave informed consent. Patient/family questions were addressed.  Follow-up:Pending Surgery

## 2014-06-13 ENCOUNTER — Encounter (HOSPITAL_COMMUNITY)
Admission: RE | Disposition: A | Discharge status: Home / Self Care | Payer: Self-pay | Source: Ambulatory Visit | Attending: General Surgery

## 2014-06-13 ENCOUNTER — Encounter (HOSPITAL_COMMUNITY): Payer: Self-pay | Admitting: *Deleted

## 2014-06-13 ENCOUNTER — Ambulatory Visit (HOSPITAL_COMMUNITY)
Admission: RE | Admit: 2014-06-13 | Discharge: 2014-06-13 | Disposition: A | Discharge status: Home / Self Care | Payer: 59 | Source: Ambulatory Visit | Attending: General Surgery | Admitting: General Surgery

## 2014-06-13 DIAGNOSIS — Z1211 Encounter for screening for malignant neoplasm of colon: Secondary | ICD-10-CM | POA: Insufficient documentation

## 2014-06-13 DIAGNOSIS — Z85038 Personal history of other malignant neoplasm of large intestine: Secondary | ICD-10-CM | POA: Insufficient documentation

## 2014-06-13 DIAGNOSIS — K5730 Diverticulosis of large intestine without perforation or abscess without bleeding: Secondary | ICD-10-CM | POA: Insufficient documentation

## 2014-06-13 HISTORY — PX: COLONOSCOPY: SHX5424

## 2014-06-13 SURGERY — COLONOSCOPY
Anesthesia: Moderate Sedation

## 2014-06-13 MED ORDER — SIMETHICONE 40 MG/0.6ML PO SUSP
ORAL | Status: DC | PRN
  Administered 2014-06-13: 08:00:00

## 2014-06-13 MED ORDER — MIDAZOLAM HCL 5 MG/5ML IJ SOLN
INTRAMUSCULAR | Status: DC | PRN
  Administered 2014-06-13: 3 mg via INTRAVENOUS
  Administered 2014-06-13: 1 mg via INTRAVENOUS

## 2014-06-13 MED ORDER — MEPERIDINE HCL 50 MG/ML IJ SOLN
INTRAMUSCULAR | Status: AC
  Filled 2014-06-13: qty 1

## 2014-06-13 MED ORDER — SODIUM CHLORIDE 0.9 % IV SOLN
INTRAVENOUS | Status: DC
  Administered 2014-06-13: 08:00:00 via INTRAVENOUS

## 2014-06-13 MED ORDER — MEPERIDINE HCL 50 MG/ML IJ SOLN
INTRAMUSCULAR | Status: DC | PRN
  Administered 2014-06-13: 50 mg via INTRAVENOUS

## 2014-06-13 MED ORDER — MIDAZOLAM HCL 5 MG/5ML IJ SOLN
INTRAMUSCULAR | Status: AC
  Filled 2014-06-13: qty 5

## 2014-06-13 NOTE — Interval H&P Note (Signed)
History and Physical Interval Note:  06/13/2014 8:19 AM  Martin Marquez  has presented today for surgery, with the diagnosis of screening, hx of colon cancer  The various methods of treatment have been discussed with the patient and family. After consideration of risks, benefits and other options for treatment, the patient has consented to  Procedure(s): COLONOSCOPY (N/A) as a surgical intervention .  The patient's history has been reviewed, patient examined, no change in status, stable for surgery.  I have reviewed the patient's chart and labs.  Questions were answered to the patient's satisfaction.     Aviva Signs A

## 2014-06-13 NOTE — Op Note (Signed)
[facility] [address] [place], [address]   COLONOSCOPY PROCEDURE REPORT     EXAM DATE: 06-27-14  PATIENT NAME:      Martin Marquez, Martin Marquez           MR #:      [id number]  BIRTHDATE:       02/10/1956      VISIT #:     [id number]  ATTENDING:     Aviva Signs, MD     STATUS:     outpatient ASSISTANT:  INDICATIONS:  The patient is a 59 yr old male here for a colonoscopy due to high risk patient with personal history of colon cancer.  PROCEDURE PERFORMED:     Colonoscopy, surveillance MEDICATIONS:     Demerol 50 mg IV and Versed 4 mg IV ESTIMATED BLOOD LOSS:     None  CONSENT: The patient understands the risks and benefits of the procedure and understands that these risks include, but are not limited to: sedation, allergic reaction, infection, perforation and/or bleeding. Alternative means of evaluation and treatment include, among others: physical exam, x-rays, and/or surgical intervention. The patient elects to proceed with this endoscopic procedure.  DESCRIPTION OF PROCEDURE: During intra-op preparation period all mechanical & medical equipment was checked for proper function. Hand hygiene and appropriate measures for infection prevention was taken. After the risks, benefits and alternatives of the procedure were thoroughly explained, Informed consent was verified, confirmed and timeout was successfully executed by the treatment team. A digital exam revealed no abnormalities of the rectum. The EC-[id number] ([id number]) endoscope was introduced through the anus and advanced to the surgical anastomosis. adequate (Trilyte was used) The instrument was then slowly withdrawn as the colon was fully examined.Estimated blood loss is zero unless otherwise noted in this procedure report.    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  The opening was medium sized.   There was evidence of a normal appearing prior surgical anastomosis.   The examination  was otherwise normal. Retroflexed views revealed no abnormalities. The scope was then completely withdrawn from the patient and the procedure terminated. SCOPE WITHDRAWAL TIME:    ADVERSE EVENTS:      There were no immediate complications.  IMPRESSIONS:     1.  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  There was evidence of prior surgical anastomosis 3.  The examination was otherwise normal  RECOMMENDATIONS:     Repeat Colonoscopy in 5 years. RECALL:  _____________________________ Aviva Signs, MD eSigned:  Aviva Signs, MD 06/27/2014 8:39 AM   cc:   CPT CODES: ICD CODES:  The ICD and CPT codes recommended by this software are interpretations from the data that the clinical staff has captured with the software.  The verification of the translation of this report to the ICD and CPT codes and modifiers is the sole responsibility of the health care institution and practicing physician where this report was generated.  [employer] will not be held responsible for the validity of the ICD and CPT codes included on this report.  AMA assumes no liability for data contained or not contained herein. CPT is a Designer, television/film set of the [employer].

## 2014-06-13 NOTE — Discharge Instructions (Signed)

## 2014-06-14 ENCOUNTER — Encounter (HOSPITAL_COMMUNITY): Payer: Self-pay | Admitting: General Surgery

## 2020-03-22 ENCOUNTER — Ambulatory Visit: Payer: 59 | Admitting: Urology
